# Patient Record
Sex: Female | Born: 1955 | Race: White | Hispanic: No | Marital: Married | Smoking: Former smoker
Health system: Southern US, Community
[De-identification: ages and names within clinical notes are randomized; demographics above are authoritative.]

## PROBLEM LIST (undated history)

## (undated) DIAGNOSIS — R1310 Dysphagia, unspecified: Secondary | ICD-10-CM

## (undated) DIAGNOSIS — G8929 Other chronic pain: Secondary | ICD-10-CM

## (undated) DIAGNOSIS — Z8719 Personal history of other diseases of the digestive system: Secondary | ICD-10-CM

## (undated) DIAGNOSIS — R51 Headache: Secondary | ICD-10-CM

## (undated) DIAGNOSIS — R109 Unspecified abdominal pain: Secondary | ICD-10-CM

## (undated) DIAGNOSIS — J989 Respiratory disorder, unspecified: Secondary | ICD-10-CM

## (undated) DIAGNOSIS — E039 Hypothyroidism, unspecified: Secondary | ICD-10-CM

## (undated) DIAGNOSIS — J45909 Unspecified asthma, uncomplicated: Secondary | ICD-10-CM

## (undated) DIAGNOSIS — T8859XA Other complications of anesthesia, initial encounter: Secondary | ICD-10-CM

## (undated) DIAGNOSIS — C801 Malignant (primary) neoplasm, unspecified: Secondary | ICD-10-CM

## (undated) DIAGNOSIS — T4145XA Adverse effect of unspecified anesthetic, initial encounter: Secondary | ICD-10-CM

## (undated) DIAGNOSIS — J302 Other seasonal allergic rhinitis: Secondary | ICD-10-CM

## (undated) DIAGNOSIS — K219 Gastro-esophageal reflux disease without esophagitis: Secondary | ICD-10-CM

## (undated) DIAGNOSIS — Z8709 Personal history of other diseases of the respiratory system: Secondary | ICD-10-CM

## (undated) DIAGNOSIS — R2230 Localized swelling, mass and lump, unspecified upper limb: Secondary | ICD-10-CM

## (undated) HISTORY — PX: APPENDECTOMY: SHX54

## (undated) HISTORY — DX: Unspecified asthma, uncomplicated: J45.909

## (undated) HISTORY — PX: ABDOMINAL HYSTERECTOMY: SHX81

## (undated) HISTORY — PX: NISSEN FUNDOPLICATION: SHX2091

## (undated) HISTORY — PX: THYROIDECTOMY: SHX17

## (undated) HISTORY — DX: Gastro-esophageal reflux disease without esophagitis: K21.9

## (undated) HISTORY — DX: Malignant (primary) neoplasm, unspecified: C80.1

## (undated) HISTORY — PX: SPLENECTOMY, TOTAL: SHX788

## (undated) HISTORY — PX: OTHER SURGICAL HISTORY: SHX169

---

## 1998-06-12 ENCOUNTER — Other Ambulatory Visit: Admission: RE | Admit: 1998-06-12 | Discharge: 1998-06-12 | Payer: Self-pay | Admitting: Obstetrics and Gynecology

## 1998-06-30 ENCOUNTER — Ambulatory Visit (HOSPITAL_COMMUNITY): Admission: RE | Admit: 1998-06-30 | Discharge: 1998-06-30 | Payer: Self-pay | Admitting: Internal Medicine

## 1998-07-31 ENCOUNTER — Other Ambulatory Visit: Admission: RE | Admit: 1998-07-31 | Discharge: 1998-07-31 | Payer: Self-pay | Admitting: Obstetrics and Gynecology

## 2000-04-27 ENCOUNTER — Other Ambulatory Visit: Admission: RE | Admit: 2000-04-27 | Discharge: 2000-04-27 | Payer: Self-pay | Admitting: Gynecology

## 2000-04-28 ENCOUNTER — Encounter (INDEPENDENT_AMBULATORY_CARE_PROVIDER_SITE_OTHER): Payer: Self-pay

## 2000-04-28 ENCOUNTER — Other Ambulatory Visit: Admission: RE | Admit: 2000-04-28 | Discharge: 2000-04-28 | Payer: Self-pay | Admitting: Obstetrics & Gynecology

## 2001-06-26 ENCOUNTER — Other Ambulatory Visit: Admission: RE | Admit: 2001-06-26 | Discharge: 2001-06-26 | Payer: Self-pay | Admitting: Obstetrics and Gynecology

## 2001-07-18 ENCOUNTER — Inpatient Hospital Stay (HOSPITAL_COMMUNITY): Admission: AD | Admit: 2001-07-18 | Discharge: 2001-07-18 | Payer: Self-pay | Admitting: Obstetrics and Gynecology

## 2001-08-04 ENCOUNTER — Ambulatory Visit (HOSPITAL_COMMUNITY): Admission: RE | Admit: 2001-08-04 | Discharge: 2001-08-04 | Payer: Self-pay | Admitting: Gastroenterology

## 2001-10-19 ENCOUNTER — Ambulatory Visit (HOSPITAL_COMMUNITY): Admission: RE | Admit: 2001-10-19 | Discharge: 2001-10-19 | Payer: Self-pay | Admitting: General Surgery

## 2001-10-19 ENCOUNTER — Encounter (INDEPENDENT_AMBULATORY_CARE_PROVIDER_SITE_OTHER): Payer: Self-pay | Admitting: *Deleted

## 2001-10-19 HISTORY — PX: LIPOMA EXCISION: SHX5283

## 2002-01-23 ENCOUNTER — Other Ambulatory Visit: Admission: RE | Admit: 2002-01-23 | Discharge: 2002-01-23 | Payer: Self-pay | Admitting: Obstetrics and Gynecology

## 2002-08-03 ENCOUNTER — Other Ambulatory Visit: Admission: RE | Admit: 2002-08-03 | Discharge: 2002-08-03 | Payer: Self-pay | Admitting: Obstetrics and Gynecology

## 2003-03-13 ENCOUNTER — Other Ambulatory Visit: Admission: RE | Admit: 2003-03-13 | Discharge: 2003-03-13 | Payer: Self-pay | Admitting: Obstetrics and Gynecology

## 2003-08-20 ENCOUNTER — Encounter: Admission: RE | Admit: 2003-08-20 | Discharge: 2003-08-20 | Payer: Self-pay | Admitting: Internal Medicine

## 2003-09-02 ENCOUNTER — Other Ambulatory Visit: Admission: RE | Admit: 2003-09-02 | Discharge: 2003-09-02 | Payer: Self-pay | Admitting: Obstetrics and Gynecology

## 2004-04-02 ENCOUNTER — Encounter: Admission: RE | Admit: 2004-04-02 | Discharge: 2004-07-01 | Payer: Self-pay | Admitting: Anesthesiology

## 2004-04-11 ENCOUNTER — Ambulatory Visit (HOSPITAL_COMMUNITY): Admission: RE | Admit: 2004-04-11 | Discharge: 2004-04-11 | Payer: Self-pay | Admitting: Anesthesiology

## 2004-05-12 ENCOUNTER — Ambulatory Visit: Payer: Self-pay | Admitting: Anesthesiology

## 2004-12-18 ENCOUNTER — Other Ambulatory Visit: Admission: RE | Admit: 2004-12-18 | Discharge: 2004-12-18 | Payer: Self-pay | Admitting: Obstetrics and Gynecology

## 2005-09-06 ENCOUNTER — Encounter: Admission: RE | Admit: 2005-09-06 | Discharge: 2005-09-06 | Payer: Self-pay | Admitting: Family Medicine

## 2009-08-27 ENCOUNTER — Encounter: Admission: RE | Admit: 2009-08-27 | Discharge: 2009-08-27 | Payer: Self-pay | Admitting: Obstetrics and Gynecology

## 2009-12-09 ENCOUNTER — Inpatient Hospital Stay (HOSPITAL_COMMUNITY): Admission: AD | Admit: 2009-12-09 | Discharge: 2009-12-13 | Payer: Self-pay | Admitting: Surgery

## 2009-12-09 HISTORY — PX: ESOPHAGOGASTRIC FUNDOPLASTY: SUR458

## 2009-12-09 HISTORY — PX: HIATAL HERNIA REPAIR: SHX195

## 2010-07-30 ENCOUNTER — Inpatient Hospital Stay (HOSPITAL_COMMUNITY)
Admission: RE | Admit: 2010-07-30 | Discharge: 2010-08-01 | Payer: Self-pay | Source: Home / Self Care | Attending: Obstetrics and Gynecology | Admitting: Obstetrics and Gynecology

## 2010-07-30 ENCOUNTER — Encounter (INDEPENDENT_AMBULATORY_CARE_PROVIDER_SITE_OTHER): Payer: Self-pay | Admitting: Obstetrics and Gynecology

## 2010-07-30 HISTORY — PX: TOTAL ABDOMINAL HYSTERECTOMY W/ BILATERAL SALPINGOOPHORECTOMY: SHX83

## 2010-09-15 ENCOUNTER — Ambulatory Visit (HOSPITAL_COMMUNITY)
Admission: RE | Admit: 2010-09-15 | Discharge: 2010-09-15 | Payer: Self-pay | Source: Home / Self Care | Attending: Obstetrics and Gynecology | Admitting: Obstetrics and Gynecology

## 2010-11-03 LAB — CBC
HCT: 33.5 % — ABNORMAL LOW (ref 36.0–46.0)
HCT: 44 % (ref 36.0–46.0)
Hemoglobin: 11.1 g/dL — ABNORMAL LOW (ref 12.0–15.0)
Hemoglobin: 14.7 g/dL (ref 12.0–15.0)
MCH: 33.2 pg (ref 26.0–34.0)
MCH: 33.7 pg (ref 26.0–34.0)
MCHC: 33.3 g/dL (ref 30.0–36.0)
MCHC: 33.4 g/dL (ref 30.0–36.0)
MCV: 101.2 fL — ABNORMAL HIGH (ref 78.0–100.0)
MCV: 99.4 fL (ref 78.0–100.0)
Platelets: 207 10*3/uL (ref 150–400)
Platelets: 272 10*3/uL (ref 150–400)
RBC: 3.31 MIL/uL — ABNORMAL LOW (ref 3.87–5.11)
RBC: 4.43 MIL/uL (ref 3.87–5.11)
RDW: 12.8 % (ref 11.5–15.5)
RDW: 13.5 % (ref 11.5–15.5)
WBC: 16.9 10*3/uL — ABNORMAL HIGH (ref 4.0–10.5)
WBC: 8.6 10*3/uL (ref 4.0–10.5)

## 2010-11-03 LAB — BASIC METABOLIC PANEL
BUN: 12 mg/dL (ref 6–23)
CO2: 28 mEq/L (ref 19–32)
Calcium: 9.7 mg/dL (ref 8.4–10.5)
Chloride: 106 mEq/L (ref 96–112)
Creatinine, Ser: 0.63 mg/dL (ref 0.4–1.2)
GFR calc Af Amer: >60 mL/min (ref >60)
GFR calc non Af Amer: >60 mL/min (ref >60)
Glucose, Bld: 99 mg/dL (ref 70–99)
Potassium: 5.1 mEq/L (ref 3.5–5.1)
Sodium: 143 mEq/L (ref 135–145)

## 2010-11-03 LAB — SURGICAL PCR SCREEN
MRSA, PCR: NEGATIVE
Staphylococcus aureus: POSITIVE — AB

## 2010-11-10 LAB — CBC
HCT: 35 % — ABNORMAL LOW (ref 36.0–46.0)
HCT: 37.6 % (ref 36.0–46.0)
HCT: 40.6 % (ref 36.0–46.0)
Hemoglobin: 11.6 g/dL — ABNORMAL LOW (ref 12.0–15.0)
Hemoglobin: 12.5 g/dL (ref 12.0–15.0)
Hemoglobin: 13.6 g/dL (ref 12.0–15.0)
MCHC: 33.1 g/dL (ref 30.0–36.0)
MCHC: 33.4 g/dL (ref 30.0–36.0)
MCHC: 33.4 g/dL (ref 30.0–36.0)
MCV: 97.5 fL (ref 78.0–100.0)
MCV: 98.2 fL (ref 78.0–100.0)
MCV: 98.4 fL (ref 78.0–100.0)
Platelets: 248 10*3/uL (ref 150–400)
Platelets: 253 10*3/uL (ref 150–400)
Platelets: 321 10*3/uL (ref 150–400)
RBC: 3.56 MIL/uL — ABNORMAL LOW (ref 3.87–5.11)
RBC: 3.82 MIL/uL — ABNORMAL LOW (ref 3.87–5.11)
RBC: 4.17 MIL/uL (ref 3.87–5.11)
RDW: 13.2 % (ref 11.5–15.5)
RDW: 13.3 % (ref 11.5–15.5)
RDW: 13.7 % (ref 11.5–15.5)
WBC: 13.7 10*3/uL — ABNORMAL HIGH (ref 4.0–10.5)
WBC: 6.3 10*3/uL (ref 4.0–10.5)
WBC: 9.8 10*3/uL (ref 4.0–10.5)

## 2011-01-08 ENCOUNTER — Other Ambulatory Visit (INDEPENDENT_AMBULATORY_CARE_PROVIDER_SITE_OTHER): Payer: Self-pay | Admitting: Surgery

## 2011-01-08 DIAGNOSIS — R102 Pelvic and perineal pain: Secondary | ICD-10-CM

## 2011-01-08 DIAGNOSIS — K469 Unspecified abdominal hernia without obstruction or gangrene: Secondary | ICD-10-CM

## 2011-01-08 NOTE — Procedures (Signed)
NAME:  MAELIN, KURKOWSKI NO.:  [id number]   MEDICAL RECORD NO.:  [id number]                   PATIENT TYPE:   LOCATION:                                       FACILITY:  [facility]   PHYSICIAN:  Celene Kras, MD                     DATE OF BIRTH:  1956/06/30   DATE OF PROCEDURE:  04/21/2004  DATE OF DISCHARGE:                                 OPERATIVE REPORT   HISTORY:  [facility].  I  evaluated her via the health and history form, with a 14-point review of  systems.  We spent a considerable period of time discussing the treatment  limitations and options with her and with her husband.  A review of the MRI.  She has biomechanical pain, and what I do believe is abdominal pain,  consistent with a possible radicular component.  From a broad-brush stroke  perspective, it is reasonable to trial at least one thoracic epidural, and  then possibly either move towards an intercostal block or hypogastric plexus  block, predicated on her decision tree.  Her pain appears to be a mixed  presentation, and I reviewed this with her.  A review of medications.  Other life style enhancements reviewed as well.   PHYSICAL EXAMINATION:  Objectively, no significant change in the  neurological or musculoskeletal presentation.   IMPRESSION:  1.  Degenerative spine disease, thoracic spine.  2.  Abdominal pain, status post traumatic incident.   PLAN:  Thoracic epidural.  She has consented.   DESCRIPTION OF PROCEDURE:  The patient is taken to the fluoroscopy suite and  placed in the prone position.  The back is prepped and draped in the usual  fashion.  Using a Hustead needle, I advanced to the T9-10 interspace without  any evidence of CSF, heme, or paresthesias.  A test block uneventfully,  followed by 40 mg of Aristocort, and flushed the needle.  The patient tolerated the procedure well with no complications from our  procedure.   Appropriate recovery.  Discharge instructions given.                                                Celene Kras, MD    HH/MEDQ  D:  04/21/2004 10:04:48  T:  04/21/2004 11:58:00  Job:  [id number]

## 2011-01-08 NOTE — Assessment & Plan Note (Signed)
PATIENT:  Sarah Huber.   DATE OF BIRTH:  09/30/1955.   SURGEON:  Jewel Baize. Stevphen Rochester, M.D.   Shaima Sardinas [facility] and I evaluated  her via health and history form and 14-point review of systems.   1.  Sarah Huber comes back today, relating that she has not had significant      benefit from aortorenal plexus block.  I do think it is reasonable at      this time to go ahead and proceed with celiac plexus block and then      reassess her.  Her pain is atypical, but if she does have a component to      her colon or intra-abdominal structures in the upper quadrant, perhaps a      celiac plexus would be more of a directed care approach.  I do not      necessarily think we need to move forward in further diagnostics at this      time and defer to primary care in that regard.   1.  Will hold off with the block today as she is anticipating a thyroid      scan.  As I relate to she and her husband, we are going to need to be      using contrast so that we can properly assess needle placement and so we      will give her a couple of weeks.   1.  The Demerol seems to be helping her.  She is aware of the potential      habituating nature and cautions with this medication, but seems to be      using it sparingly and appropriately as she brings this in.   1.  Predicate further therapeutic adjustment based on overall response.   OBJECTIVE:  Exam not performed today.  She has no new neurologic or  musculoskeletal so described historically.   IMPRESSION:  Abdominal pain, unspecified.   PLAN:  Celiac plexus block and followup.  Discharge instructions given.  We  will see her in followup.  Nursing teaching and physician discussion with  she and her husband.       HH/MedQ  D:  05/26/2004 10:24:43  T:  05/26/2004 11:20:45  Job #:  [id number]

## 2011-01-08 NOTE — Op Note (Signed)
[facility]  Patient:    Sarah Huber, Sarah Huber Visit Number: [id number] MRN: [id number]          Service Type: DSU Location: DAY Attending Physician:  Carson Myrtle Dictated by:   Sheppard Plumber Earlene Plater, M.D. Proc. Date: 10/19/01 Admit Date:  10/19/2001                             Operative Report  PREOPERATIVE DIAGNOSIS:  Mass left groin, suspect node.  POSTOPERATIVE DIAGNOSIS:  Lipoma left groin.  OPERATION:  SURGEON:  Timothy E. Earlene Plater, M.D.  ASSISTANT:  Dellia Beckwith, M.D.  ANESTHESIA:  Local standby.  INDICATIONS:  The patient has had recurrent problems with apparent hidradenitis and lymph node enlargement in the groin areas.  A previous node has been removed from the left groin.  She presented in the office with a worrisome mass that she wished to have removed.  DESCRIPTION OF PROCEDURE:  The patient was taken to the operating room, IV started and sedation given.  She and I localized the node and agreed to its position.  The area was shaved, prepped and draped in the usual fashion, and 0.25% Marcaine was used for local anesthesia.  A skin incision was made over the palpable mass and a lipoma popped into the wound, and was completely removed and submitted to pathology.  Careful exploration freed the wound and through the skin revealed no other nodularity or suspected masses.  This completed the procedure.  The wound was closed with subcuticular sutures and Steri-Strips.  Counts were correct.  She tolerated it well.  She was removed to the recovery room in good condition.  INSTRUCTIONS:  Written and verbal instructions include Darvocet-N #24 as needed, and she will be followed as an outpatient. Dictated by:   Sheppard Plumber Earlene Plater, M.D. Attending Physician:  Carson Myrtle DD:  10/19/01 TD:  10/19/01 Job: [id number]

## 2011-01-08 NOTE — Procedures (Signed)
[place]  Patient:    Sarah Huber, Sarah Huber Visit Number: [id number] MRN: [id number]          Service Type: END Location: ENDO Attending Physician:  Charna [name] Dictated by:   Anselmo Rod, M.D. Proc. Date: 08/04/01 Admit Date:  08/04/2001   CC:         Marcelle Overlie, M.D.  Crista Luria, M.D.   Procedure Report  DATE OF BIRTH:  [birth year]  PROCEDURE PERFORMED:  Colonoscopy.  ENDOSCOPIST:  Anselmo Rod, M.D.  INSTRUMENT USED:  Olympus video colonoscope.  INDICATION FOR PROCEDURE:  A 55 year old white female undergoing a colonoscopy for colorectal cancer screening.  The patient has a family history of colon cancer.  PREPROCEDURE PREPARATION:  Informed consent was procured from the patient. The patient was fasted for 8 hours prior to the procedure and prepped with a bottle of magnesium citrate and a gallon of NuLytely the night prior to the procedure.  PREPROCEDURE PHYSICAL:  Patient has stable vital signs.  NECK: Supple.  CHEST:  Clear to auscultation. S1, S2 regular.  ABDOMEN:  Soft with normal bowel sounds.  DESCRIPTION OF PROCEDURE:  The patient was placed in the left lateral decubitus position and sedated with 60 mg of Demerol and 6 mg of Versed intravenously.  Once the patient was adequately sedated and maintained on low-flow oxygen and continuous cardiac monitoring, the Olympus video colonoscope was advanced from the rectum to the cecum without difficulty. Except for a few left-sided diverticula and small, nonbleeding internal and external hemorrhoids, no other abnormalities were seen.  IMPRESSION: 1. Early left-sided diverticulosis. 2. Small, nonbleeding internal and external hemorrhoids. 3. No masses or polyps seen.  RECOMMENDATIONS: 1. High fiber diet has been recommended for the patient. 2. Repeat colorectal cancer screening was recommended in the next five years    unless the patient were to develop any  abnormal symptoms in the interim. 3. Outpatient follow-up was advised on a p.r.n. basis. Dictated by:   Anselmo Rod, M.D. Attending Physician:  Charna [name] DD:  08/04/01 TD:  08/04/01 Job: [id number]

## 2011-01-08 NOTE — Procedures (Signed)
NAME:  Sarah Huber, Sarah Huber                           ACCOUNT NO.:  [id number]   MEDICAL RECORD NO.:  [id number]                   PATIENT TYPE:  REC   LOCATION:  TPC                                  FACILITY:  [facility]   PHYSICIAN:  Celene Kras, MD                     DATE OF BIRTH:  [birth year]   DATE OF PROCEDURE:  05/12/2004  DATE OF DISCHARGE:                                 OPERATIVE REPORT   PATIENT:  Sarah Huber.   DATE OF BIRTH:  [birth year].   SURGEON:  Jewel Baize. Stevphen Rochester, M.D.   [facility] and I evaluated  her. I have reviewed the Health and history form. I reviewed the 14-point  review of systems.   1.  Sarah Huber was afforded only modest benefit from previous interventional      procedure, that being thoracic epidural. She is still complaining of      intra-abdominal discomfort, seemingly more internal, left radiation,      typical to her pain. I reviewed the MRI, progress to date, and overall      directed care approach.  2.  I think it is reasonable to at least trial a plexus block as both      diagnostic and therapeutic. She is moving forward with further      diagnostics with this vague diagnosis of colonic reflux but is being      assessed also for a covert cancer. She is losing weight, and I do      believe that we need to be fairly aggressive in our interventional and      pain management approach.  3.  I am going to place her on Demerol. I understand the unique      characteristics of this drug and relate them to her. As Demerol does      have a central dampening effect to a moderate degree, may actually work      a little bit better with the mixed picture of her pain presentation,      most likely secondary to central amplification and vague abdominal      discomfort.  4.  Other lifestyle enhancements reviewed. She is a forthright individual. I      reviewed the risks of this medication, potential complications,  potential habituation nature, cautions to driving and making important      cognitive decisions. I also reviewed the opioid consent.   OBJECTIVE:  Diffuse abdominal discomfort directed leftward, just inferior to  the rib margin. Identified no hepatosplenomegaly or flank pain. No evidence  of further advancing spinal __________ disorder.   IMPRESSION:  Abdominal pain, unspecified. Colonic reflux, cancer workup in  process. Abdominal pain with radiation, most likely secondary to accentuated  central amplification.   PLAN:  Aortorenal/iliac plexus block. She is consented. I chose this block  as I do not think that the hypogastric component is our most problematic  feature as her pain is more upper quadrant. It is not classically directly  related to either pancreatic component or an actual colonic component but  does have some features here. We may trial a little bit lower on the  injection today, possibly moving directly to a celiac plexus block and  follow up if she has a response. Risks, complications, and options are fully  outlined, and she wishes to proceed.   She is consented.   The patient is taken to the fluoroscopy suite and placed in a prone  position. Back prepped and draped in the usual fashion. Using a 22-gauge  spinal needle, I advanced the anterior surface of L2-3 and confirmed  placement of multiple fluoroscopic positions. I test blocked uneventfully.  No heme, CSF, or paresthesia identified during the procedure and then  followed with 4 cc of lidocaine 1% MPF and 40 mg of Aristocort.   Tolerated this procedure well. Near complete diminishment in pain perception  in the recovery area. Discharge instructions given. Will see her in  followup.       HH/MEDQ  D:  05/12/2004 09:56:13  T:  05/13/2004 66:44:03  Job:  [id number]

## 2011-01-12 ENCOUNTER — Ambulatory Visit (HOSPITAL_COMMUNITY)
Admission: RE | Admit: 2011-01-12 | Discharge: 2011-01-12 | Disposition: A | Discharge status: Home / Self Care | Payer: PRIVATE HEALTH INSURANCE | Source: Ambulatory Visit | Attending: Surgery | Admitting: Surgery

## 2011-01-12 DIAGNOSIS — R102 Pelvic and perineal pain: Secondary | ICD-10-CM

## 2011-01-12 DIAGNOSIS — R1032 Left lower quadrant pain: Secondary | ICD-10-CM | POA: Insufficient documentation

## 2011-01-12 DIAGNOSIS — Z90710 Acquired absence of both cervix and uterus: Secondary | ICD-10-CM | POA: Insufficient documentation

## 2011-01-12 DIAGNOSIS — K469 Unspecified abdominal hernia without obstruction or gangrene: Secondary | ICD-10-CM

## 2011-01-12 MED ORDER — IOHEXOL 300 MG/ML  SOLN
100.0000 mL | Freq: Once | INTRAMUSCULAR | Status: AC | PRN
  Administered 2011-01-12: 100 mL via INTRAVENOUS

## 2011-01-21 ENCOUNTER — Encounter (HOSPITAL_COMMUNITY): Payer: PRIVATE HEALTH INSURANCE

## 2011-01-21 ENCOUNTER — Other Ambulatory Visit (INDEPENDENT_AMBULATORY_CARE_PROVIDER_SITE_OTHER): Payer: Self-pay | Admitting: Surgery

## 2011-01-21 LAB — BASIC METABOLIC PANEL
BUN: 19 mg/dL (ref 6–23)
CO2: 29 mEq/L (ref 19–32)
Calcium: 10.1 mg/dL (ref 8.4–10.5)
Chloride: 103 mEq/L (ref 96–112)
Creatinine, Ser: 0.8 mg/dL (ref 0.4–1.2)
GFR calc Af Amer: >60 mL/min (ref >60)
GFR calc non Af Amer: >60 mL/min (ref >60)
Glucose, Bld: 81 mg/dL (ref 70–99)
Potassium: 4.3 mEq/L (ref 3.5–5.1)
Sodium: 139 mEq/L (ref 135–145)

## 2011-01-21 LAB — SURGICAL PCR SCREEN
MRSA, PCR: NEGATIVE
Staphylococcus aureus: NEGATIVE

## 2011-01-21 LAB — CBC
HCT: 38.9 % (ref 36.0–46.0)
Hemoglobin: 13.1 g/dL (ref 12.0–15.0)
MCH: 32.1 pg (ref 26.0–34.0)
MCHC: 33.7 g/dL (ref 30.0–36.0)
MCV: 95.3 fL (ref 78.0–100.0)
Platelets: 340 10*3/uL (ref 150–400)
RBC: 4.08 MIL/uL (ref 3.87–5.11)
RDW: 12.5 % (ref 11.5–15.5)
WBC: 8.4 10*3/uL (ref 4.0–10.5)

## 2011-01-27 ENCOUNTER — Ambulatory Visit (HOSPITAL_COMMUNITY)
Admission: AD | Admit: 2011-01-27 | Discharge: 2011-01-29 | Disposition: A | Discharge status: Home / Self Care | Payer: PRIVATE HEALTH INSURANCE | Source: Ambulatory Visit | Attending: Surgery | Admitting: Surgery

## 2011-01-27 ENCOUNTER — Other Ambulatory Visit (INDEPENDENT_AMBULATORY_CARE_PROVIDER_SITE_OTHER): Payer: Self-pay | Admitting: Surgery

## 2011-01-27 DIAGNOSIS — IMO0002 Reserved for concepts with insufficient information to code with codable children: Secondary | ICD-10-CM | POA: Insufficient documentation

## 2011-01-27 DIAGNOSIS — M7989 Other specified soft tissue disorders: Secondary | ICD-10-CM | POA: Insufficient documentation

## 2011-01-27 DIAGNOSIS — Y836 Removal of other organ (partial) (total) as the cause of abnormal reaction of the patient, or of later complication, without mention of misadventure at the time of the procedure: Secondary | ICD-10-CM | POA: Insufficient documentation

## 2011-01-27 DIAGNOSIS — J45909 Unspecified asthma, uncomplicated: Secondary | ICD-10-CM | POA: Insufficient documentation

## 2011-01-27 DIAGNOSIS — Z9109 Other allergy status, other than to drugs and biological substances: Secondary | ICD-10-CM | POA: Insufficient documentation

## 2011-01-27 DIAGNOSIS — R1032 Left lower quadrant pain: Secondary | ICD-10-CM | POA: Insufficient documentation

## 2011-01-27 DIAGNOSIS — Z90710 Acquired absence of both cervix and uterus: Secondary | ICD-10-CM | POA: Insufficient documentation

## 2011-01-27 DIAGNOSIS — Z79899 Other long term (current) drug therapy: Secondary | ICD-10-CM | POA: Insufficient documentation

## 2011-01-27 HISTORY — PX: LAPAROSCOPIC LYSIS OF ADHESIONS: SHX5905

## 2011-02-01 ENCOUNTER — Encounter (INDEPENDENT_AMBULATORY_CARE_PROVIDER_SITE_OTHER): Payer: Self-pay | Admitting: Surgery

## 2011-02-03 NOTE — Op Note (Signed)
  [name], Sarah NO.:  [id number]  MEDICAL RECORD NO.:  [id number]  LOCATION:  1526                         FACILITY:  [facility]  PHYSICIAN:  Thornton Park. Daphine Deutscher, MD  DATE OF BIRTH:  December 13, 1955  DATE OF PROCEDURE:  01/27/2011 DATE OF DISCHARGE:                              OPERATIVE REPORT   PREOPERATIVE DIAGNOSIS:  Persistent left lower quadrant abdominal pain after a hysterectomy through a Pfannenstiel incision.  PROCEDURE:  Laparoscopy, exploration of wound, and resection of scar tissue.  SURGEON:  Thornton Park. Daphine Deutscher, MD  ASSISTANT:  None.  ANESTHESIA:  General endotracheal.  DESCRIPTION OF PROCEDURE:  This 55 year old white female was taken to room #1 at [facility] after I had marked her point of maximal tenderness.  This was kind of the mediolateral aspect of a Pfannenstiel incision on the left side.  I went in initially trying to get into the right upper quadrant, but met some resistance and I did not push further with the Optiview, but went over on the left side and got in and then was actually extraperitoneal on the right side.  Through this, two 5 mm ports and an angled 5 mm 30-degree scope, I was able to completely examine the pelvis including the point of maximal tenderness.  At that point, there was no evidence of any kind of hernia or anything __________ .  I surveyed this very well.  I then, with the scope in place, marked cutdown on the area where she had had the tenderness and I felt some lumpiness to this scar tissue.  I did find 1 fairly large vessel __________, which I oversewed with a figure-of-8 suture of 4-0 Vicryl.  I cut out these knots and sent this off for pathology.  I doubt that she has endometriosis considering her age, but I went ahead and sent all that to look for that as well.  I injected her with some Exparel in all 3 wounds.  Because she is allergic to adhesive tape, I closed her with 4-0 Vicryl subcutaneously and  subcuticularly and put Adaptic on the wounds and some light dressings.  She will be kept overnight for observation.  I did inject her with Exparel for long-acting pain medicine on her incision.  The patient will be followed up and will be kept for overnight observation.     Thornton Park Daphine Deutscher, MD     MBM/MEDQ  D:  01/27/2011  T:  01/27/2011  Job:  [id number]  cc:   Marcelino Duster L. Vincente Poli, M.D. Fax: [phone]  Electronically Signed by Luretha Murphy MD on 02/03/2011 04:33:34 PM

## 2011-02-22 ENCOUNTER — Ambulatory Visit (INDEPENDENT_AMBULATORY_CARE_PROVIDER_SITE_OTHER): Payer: PRIVATE HEALTH INSURANCE | Admitting: Surgery

## 2011-02-22 ENCOUNTER — Encounter (INDEPENDENT_AMBULATORY_CARE_PROVIDER_SITE_OTHER): Payer: Self-pay | Admitting: Surgery

## 2011-02-22 DIAGNOSIS — Z9889 Other specified postprocedural states: Secondary | ICD-10-CM

## 2011-02-22 NOTE — Progress Notes (Signed)
The patient returns today after I had explored the wound done by her gynecologist. I excised the scar tissue which pathology showed a benign. Symptomatically her pain is resolved and she seems to be doing well.  Her swallowing is fine and she is not reporting reflux. She saw Claire Shown who is following her for some gastric emptying issues of a chronic nature. She overall looks well I think is doing fine and will follow as needed.

## 2012-01-06 ENCOUNTER — Other Ambulatory Visit: Payer: Self-pay | Admitting: Obstetrics and Gynecology

## 2012-11-16 ENCOUNTER — Encounter (INDEPENDENT_AMBULATORY_CARE_PROVIDER_SITE_OTHER): Payer: Self-pay

## 2012-11-17 ENCOUNTER — Ambulatory Visit (INDEPENDENT_AMBULATORY_CARE_PROVIDER_SITE_OTHER): Payer: BC Managed Care – PPO | Admitting: General Surgery

## 2012-11-17 ENCOUNTER — Other Ambulatory Visit (INDEPENDENT_AMBULATORY_CARE_PROVIDER_SITE_OTHER): Payer: Self-pay | Admitting: General Surgery

## 2012-11-17 ENCOUNTER — Encounter (INDEPENDENT_AMBULATORY_CARE_PROVIDER_SITE_OTHER): Payer: Self-pay | Admitting: General Surgery

## 2012-11-17 VITALS — BP 122/84 | HR 98 | Temp 98.8°F | Resp 18 | Ht 66.0 in | Wt 145.2 lb

## 2012-11-17 DIAGNOSIS — R2230 Localized swelling, mass and lump, unspecified upper limb: Secondary | ICD-10-CM | POA: Insufficient documentation

## 2012-11-17 DIAGNOSIS — R2232 Localized swelling, mass and lump, left upper limb: Secondary | ICD-10-CM

## 2012-11-17 DIAGNOSIS — R229 Localized swelling, mass and lump, unspecified: Secondary | ICD-10-CM

## 2012-11-21 DIAGNOSIS — R2230 Localized swelling, mass and lump, unspecified upper limb: Secondary | ICD-10-CM

## 2012-11-21 HISTORY — DX: Localized swelling, mass and lump, unspecified upper limb: R22.30

## 2012-11-29 ENCOUNTER — Other Ambulatory Visit (INDEPENDENT_AMBULATORY_CARE_PROVIDER_SITE_OTHER): Payer: Self-pay | Admitting: General Surgery

## 2012-11-29 ENCOUNTER — Ambulatory Visit
Admission: RE | Admit: 2012-11-29 | Discharge: 2012-11-29 | Disposition: A | Discharge status: Home / Self Care | Payer: BC Managed Care – PPO | Source: Ambulatory Visit | Attending: General Surgery | Admitting: General Surgery

## 2012-11-29 DIAGNOSIS — R2232 Localized swelling, mass and lump, left upper limb: Secondary | ICD-10-CM

## 2012-12-04 ENCOUNTER — Encounter (INDEPENDENT_AMBULATORY_CARE_PROVIDER_SITE_OTHER): Payer: Self-pay | Admitting: General Surgery

## 2012-12-04 ENCOUNTER — Ambulatory Visit (INDEPENDENT_AMBULATORY_CARE_PROVIDER_SITE_OTHER): Payer: BC Managed Care – PPO | Admitting: General Surgery

## 2012-12-04 VITALS — BP 122/82 | HR 68 | Temp 97.3°F | Resp 12 | Ht 66.0 in | Wt 147.2 lb

## 2012-12-04 DIAGNOSIS — R2232 Localized swelling, mass and lump, left upper limb: Secondary | ICD-10-CM

## 2012-12-04 DIAGNOSIS — R229 Localized swelling, mass and lump, unspecified: Secondary | ICD-10-CM

## 2012-12-04 NOTE — Patient Instructions (Signed)
Plan to excise mass left axilla

## 2012-12-04 NOTE — Progress Notes (Signed)
Subjective:     Patient ID: Sarah Huber, female   DOB: 1956-06-03, 57 y.o.   MRN: [id number]  HPI We're asked to see the patient in consultation by Dr. Tawanna Cooler to evaluate her for a mass in her left axilla. The patient is a 57 year old white female who first noticed a mass in the left axilla around November. She has discomfort associated with it and also notes some numbness down the triceps area of her left arm. She believes the areas gotten a little bit larger since she first noticed it. She is concerned about this because she states she has had thyroid cancer and cervical cancer. She has maintained her weight.  Review of Systems  Constitutional: Negative.   HENT: Negative.   Eyes: Negative.   Respiratory: Negative.   Cardiovascular: Negative.   Gastrointestinal: Negative.   Endocrine: Negative.   Genitourinary: Negative.   Musculoskeletal: Negative.   Skin: Negative.   Allergic/Immunologic: Negative.   Neurological: Negative.   Hematological: Negative.   Psychiatric/Behavioral: Negative.        Objective:   Physical Exam  Constitutional: She is oriented to person, place, and time. She appears well-developed and well-nourished.  HENT:  Head: Normocephalic and atraumatic.  Eyes: Conjunctivae and EOM are normal. Pupils are equal, round, and reactive to light.  Neck: Normal range of motion. Neck supple.  Cardiovascular: Normal rate, regular rhythm and normal heart sounds.   Pulmonary/Chest: Effort normal and breath sounds normal.  Abdominal: Soft. Bowel sounds are normal.  Musculoskeletal: Normal range of motion.  There is a small palpable fatty fullness in the left axilla  Lymphadenopathy:    She has no cervical adenopathy.  Neurological: She is alert and oriented to person, place, and time.  Skin: Skin is warm and dry.  Psychiatric: She has a normal mood and affect. Her behavior is normal.       Assessment:     The patient has what feels like a small lipoma in the left  axilla. Because of her history of cancer I think it would be reasonable to ultrasound the axilla to make sure there is no other abnormalities present. She also probably needs a mammogram to make sure there are no lesions of the breast.     Plan:     We will obtain these studies and plan to see her back in the next 2-3 weeks

## 2012-12-04 NOTE — Progress Notes (Signed)
Subjective:     Patient ID: Sarah Huber, female   DOB: 1956-03-08, 57 y.o.   MRN: [id number]  HPI The patient is a 57 -year-old white female who first noticed a mass in the left axilla around November. The mass is causing her discomfort and some numbness down her left arm. It is gradually getting larger. She is very concerned about it would like to have it removed. 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  Review of Systems  Constitutional: Negative.   HENT: Negative.   Eyes: Negative.   Respiratory: Negative.   Cardiovascular: Negative.   Gastrointestinal: Negative.   Endocrine: Negative.   Genitourinary: Negative.   Musculoskeletal: Negative.   Skin: Negative.   Allergic/Immunologic: Negative.   Neurological: Negative.   Hematological: Negative.   Psychiatric/Behavioral: Negative.        Objective:   Physical Exam  Constitutional: She is oriented to person, place, and time. She appears well-developed and well-nourished.  HENT:  Head: Normocephalic and atraumatic.  Eyes: Conjunctivae and EOM are normal. Pupils are equal, round, and reactive to light.  Neck: Normal range of motion. Neck supple.  Cardiovascular: Normal rate, regular rhythm and normal heart sounds.   Pulmonary/Chest: Effort normal and breath sounds normal.  Abdominal: Soft. Bowel sounds are normal.  Musculoskeletal: Normal range of motion.  There is some fatty prominence in the left axilla  Neurological: She is alert and oriented to person, place, and time.  Skin: Skin is warm and dry.  Psychiatric: She has a normal mood and affect. Her behavior is normal.       Assessment:     The patient has what appears to be a small lipoma in the left axilla which is causing her some discomfort. Because of this I think it would be reasonable to remove it. I have  discussed with her in detail the risks and benefits of the operation to do this as well as some of the technical aspects and she understands and wishes to proceed. She understands that removing this is not guaranteed to relieve her discomfort or numbness in her arm.     Plan:     Plan to excise mass from the left axilla

## 2012-12-05 ENCOUNTER — Ambulatory Visit
Admission: RE | Admit: 2012-12-05 | Discharge: 2012-12-05 | Disposition: A | Discharge status: Home / Self Care | Payer: BC Managed Care – PPO | Source: Ambulatory Visit | Attending: General Surgery | Admitting: General Surgery

## 2012-12-05 ENCOUNTER — Other Ambulatory Visit (INDEPENDENT_AMBULATORY_CARE_PROVIDER_SITE_OTHER): Payer: Self-pay | Admitting: General Surgery

## 2012-12-05 ENCOUNTER — Other Ambulatory Visit: Payer: BC Managed Care – PPO

## 2012-12-05 DIAGNOSIS — R2232 Localized swelling, mass and lump, left upper limb: Secondary | ICD-10-CM

## 2012-12-12 ENCOUNTER — Telehealth (INDEPENDENT_AMBULATORY_CARE_PROVIDER_SITE_OTHER): Payer: Self-pay

## 2012-12-12 NOTE — Telephone Encounter (Signed)
LMOM to call back. Path came back benign.

## 2012-12-13 ENCOUNTER — Telehealth (INDEPENDENT_AMBULATORY_CARE_PROVIDER_SITE_OTHER): Payer: Self-pay

## 2012-12-13 NOTE — Telephone Encounter (Signed)
Pt returned call to [facility]. I advised her per note in epic her pathology is benign.

## 2012-12-14 ENCOUNTER — Encounter (HOSPITAL_BASED_OUTPATIENT_CLINIC_OR_DEPARTMENT_OTHER): Payer: Self-pay | Admitting: *Deleted

## 2012-12-14 NOTE — Pre-Procedure Instructions (Signed)
Discussed small airway and hx. Of multiple vocal cord surgeries with Dr. Gelene Mink; pt. OK to come for surgery.  Copy of last anesthesia record (2012) placed on chart.

## 2012-12-14 NOTE — Pre-Procedure Instructions (Signed)
To come 12/19/2012 for BMET

## 2012-12-15 ENCOUNTER — Encounter (HOSPITAL_BASED_OUTPATIENT_CLINIC_OR_DEPARTMENT_OTHER)
Admission: RE | Admit: 2012-12-15 | Discharge: 2012-12-15 | Disposition: A | Discharge status: Home / Self Care | Payer: BC Managed Care – PPO | Source: Ambulatory Visit | Attending: General Surgery | Admitting: General Surgery

## 2012-12-15 LAB — BASIC METABOLIC PANEL
Calcium: 9.4 mg/dL (ref 8.4–10.5)
GFR calc Af Amer: >90 mL/min (ref >90)
GFR calc non Af Amer: 81 mL/min — ABNORMAL LOW (ref >90)
Potassium: 5 mEq/L (ref 3.5–5.1)
Sodium: 139 mEq/L (ref 135–145)

## 2012-12-20 ENCOUNTER — Encounter (HOSPITAL_BASED_OUTPATIENT_CLINIC_OR_DEPARTMENT_OTHER): Payer: Self-pay | Admitting: *Deleted

## 2012-12-20 ENCOUNTER — Encounter (HOSPITAL_BASED_OUTPATIENT_CLINIC_OR_DEPARTMENT_OTHER)
Admission: RE | Disposition: A | Discharge status: Home / Self Care | Payer: Self-pay | Source: Ambulatory Visit | Attending: General Surgery

## 2012-12-20 ENCOUNTER — Encounter (HOSPITAL_BASED_OUTPATIENT_CLINIC_OR_DEPARTMENT_OTHER): Payer: Self-pay | Admitting: Certified Registered Nurse Anesthetist

## 2012-12-20 ENCOUNTER — Ambulatory Visit (HOSPITAL_BASED_OUTPATIENT_CLINIC_OR_DEPARTMENT_OTHER): Payer: BC Managed Care – PPO | Admitting: Certified Registered Nurse Anesthetist

## 2012-12-20 ENCOUNTER — Ambulatory Visit (HOSPITAL_BASED_OUTPATIENT_CLINIC_OR_DEPARTMENT_OTHER)
Admission: RE | Admit: 2012-12-20 | Discharge: 2012-12-20 | Disposition: A | Discharge status: Home / Self Care | Payer: BC Managed Care – PPO | Source: Ambulatory Visit | Attending: General Surgery | Admitting: General Surgery

## 2012-12-20 DIAGNOSIS — R2232 Localized swelling, mass and lump, left upper limb: Secondary | ICD-10-CM

## 2012-12-20 DIAGNOSIS — R229 Localized swelling, mass and lump, unspecified: Secondary | ICD-10-CM | POA: Insufficient documentation

## 2012-12-20 DIAGNOSIS — D1739 Benign lipomatous neoplasm of skin and subcutaneous tissue of other sites: Secondary | ICD-10-CM

## 2012-12-20 HISTORY — DX: Unspecified abdominal pain: R10.9

## 2012-12-20 HISTORY — PX: MASS EXCISION: SHX2000

## 2012-12-20 HISTORY — DX: Other chronic pain: G89.29

## 2012-12-20 HISTORY — DX: Hypothyroidism, unspecified: E03.9

## 2012-12-20 HISTORY — DX: Other seasonal allergic rhinitis: J30.2

## 2012-12-20 HISTORY — DX: Headache: R51

## 2012-12-20 HISTORY — DX: Personal history of other diseases of the respiratory system: Z87.09

## 2012-12-20 HISTORY — DX: Dysphagia, unspecified: R13.10

## 2012-12-20 HISTORY — DX: Adverse effect of unspecified anesthetic, initial encounter: T41.45XA

## 2012-12-20 HISTORY — DX: Localized swelling, mass and lump, unspecified upper limb: R22.30

## 2012-12-20 HISTORY — DX: Respiratory disorder, unspecified: J98.9

## 2012-12-20 HISTORY — DX: Personal history of other diseases of the digestive system: Z87.19

## 2012-12-20 HISTORY — DX: Other complications of anesthesia, initial encounter: T88.59XA

## 2012-12-20 SURGERY — EXCISION MASS
Anesthesia: General | Site: Axilla | Laterality: Left | Wound class: Clean

## 2012-12-20 MED ORDER — ONDANSETRON HCL 4 MG/2ML IJ SOLN: 4.0000 mg | Freq: Once | INTRAMUSCULAR | Status: DC | PRN

## 2012-12-20 MED ORDER — MIDAZOLAM HCL 2 MG/ML PO SYRP: 12.0000 mg | ORAL_SOLUTION | Freq: Once | ORAL | Status: DC | PRN

## 2012-12-20 MED ORDER — VANCOMYCIN HCL IN DEXTROSE 1-5 GM/200ML-% IV SOLN
1000.0000 mg | INTRAVENOUS | Status: AC
  Administered 2012-12-20: 1000 mg via INTRAVENOUS

## 2012-12-20 MED ORDER — OXYCODONE-ACETAMINOPHEN 5-325 MG PO TABS: 1.0000 | ORAL_TABLET | ORAL | Status: DC | PRN

## 2012-12-20 MED ORDER — MIDAZOLAM HCL 5 MG/5ML IJ SOLN
INTRAMUSCULAR | Status: DC | PRN
  Administered 2012-12-20: 2 mg via INTRAVENOUS

## 2012-12-20 MED ORDER — OXYCODONE HCL 5 MG/5ML PO SOLN
5.0000 mg | Freq: Once | ORAL | Status: AC | PRN
Start: 2012-12-20 — End: 2012-12-20

## 2012-12-20 MED ORDER — FENTANYL CITRATE 0.05 MG/ML IJ SOLN
INTRAMUSCULAR | Status: DC | PRN
  Administered 2012-12-20: 100 ug via INTRAVENOUS

## 2012-12-20 MED ORDER — BUPIVACAINE-EPINEPHRINE 0.25% -1:200000 IJ SOLN
INTRAMUSCULAR | Status: DC | PRN
  Administered 2012-12-20: 16 mL

## 2012-12-20 MED ORDER — ONDANSETRON HCL 4 MG/2ML IJ SOLN
INTRAMUSCULAR | Status: DC | PRN
  Administered 2012-12-20: 4 mg via INTRAVENOUS

## 2012-12-20 MED ORDER — HYDROMORPHONE HCL PF 1 MG/ML IJ SOLN
0.2500 mg | INTRAMUSCULAR | Status: DC | PRN
  Administered 2012-12-20 (×2): 0.25 mg via INTRAVENOUS
  Administered 2012-12-20 (×3): 0.5 mg via INTRAVENOUS

## 2012-12-20 MED ORDER — MIDAZOLAM HCL 2 MG/2ML IJ SOLN: 1.0000 mg | INTRAMUSCULAR | Status: DC | PRN

## 2012-12-20 MED ORDER — PROPOFOL 10 MG/ML IV BOLUS
INTRAVENOUS | Status: DC | PRN
  Administered 2012-12-20: 200 mg via INTRAVENOUS

## 2012-12-20 MED ORDER — TRAMADOL HCL 50 MG PO TABS: 50.0000 mg | ORAL_TABLET | Freq: Four times a day (QID) | ORAL | Status: DC | PRN

## 2012-12-20 MED ORDER — CHLORHEXIDINE GLUCONATE 4 % EX LIQD: 1.0000 "application " | Freq: Once | CUTANEOUS | Status: DC

## 2012-12-20 MED ORDER — LACTATED RINGERS IV SOLN
INTRAVENOUS | Status: DC
  Administered 2012-12-20 (×2): via INTRAVENOUS

## 2012-12-20 MED ORDER — LIDOCAINE HCL (CARDIAC) 20 MG/ML IV SOLN
INTRAVENOUS | Status: DC | PRN
  Administered 2012-12-20: 80 mg via INTRAVENOUS

## 2012-12-20 MED ORDER — OXYCODONE HCL 5 MG PO TABS
5.0000 mg | ORAL_TABLET | Freq: Once | ORAL | Status: AC | PRN
  Administered 2012-12-20: 5 mg via ORAL

## 2012-12-20 MED ORDER — HYDROMORPHONE HCL PF 1 MG/ML IJ SOLN
0.2500 mg | INTRAMUSCULAR | Status: DC | PRN
  Administered 2012-12-20: 0.25 mg via INTRAVENOUS

## 2012-12-20 MED ORDER — FENTANYL CITRATE 0.05 MG/ML IJ SOLN: 50.0000 ug | INTRAMUSCULAR | Status: DC | PRN

## 2012-12-20 MED ORDER — DEXAMETHASONE SODIUM PHOSPHATE 4 MG/ML IJ SOLN
INTRAMUSCULAR | Status: DC | PRN
  Administered 2012-12-20: 8 mg via INTRAVENOUS

## 2012-12-20 SURGICAL SUPPLY — 46 items
ADH SKN CLS APL DERMABOND .7 (GAUZE/BANDAGES/DRESSINGS) ×1
BLADE SURG 10 STRL SS (BLADE) ×2 IMPLANT
BLADE SURG 15 STRL LF DISP TIS (BLADE) ×1 IMPLANT
BLADE SURG 15 STRL SS (BLADE) ×2
CANISTER SUCTION 1200CC (MISCELLANEOUS) IMPLANT
CHLORAPREP W/TINT 26ML (MISCELLANEOUS) ×2 IMPLANT
CLOTH BEACON ORANGE TIMEOUT ST (SAFETY) ×2 IMPLANT
COVER MAYO STAND STRL (DRAPES) ×2 IMPLANT
COVER TABLE BACK 60X90 (DRAPES) ×2 IMPLANT
DECANTER SPIKE VIAL GLASS SM (MISCELLANEOUS) IMPLANT
DERMABOND ADVANCED (GAUZE/BANDAGES/DRESSINGS) ×1
DERMABOND ADVANCED .7 DNX12 (GAUZE/BANDAGES/DRESSINGS) ×1 IMPLANT
DRAPE PED LAPAROTOMY (DRAPES) ×2 IMPLANT
DRAPE UTILITY XL STRL (DRAPES) ×2 IMPLANT
ELECT COATED BLADE 2.86 ST (ELECTRODE) ×2 IMPLANT
ELECT REM PT RETURN 9FT ADLT (ELECTROSURGICAL) ×2
ELECTRODE REM PT RTRN 9FT ADLT (ELECTROSURGICAL) ×1 IMPLANT
GAUZE SPONGE 4X4 16PLY XRAY LF (GAUZE/BANDAGES/DRESSINGS) IMPLANT
GLOVE BIO SURGEON STRL SZ7.5 (GLOVE) ×2 IMPLANT
GOWN PREVENTION PLUS XLARGE (GOWN DISPOSABLE) ×2 IMPLANT
NDL HYPO 25X1 1.5 SAFETY (NEEDLE) IMPLANT
NEEDLE HYPO 25X1 1.5 SAFETY (NEEDLE) IMPLANT
NS IRRIG 1000ML POUR BTL (IV SOLUTION) ×2 IMPLANT
PACK BASIN DAY SURGERY FS (CUSTOM PROCEDURE TRAY) ×2 IMPLANT
PENCIL BUTTON HOLSTER BLD 10FT (ELECTRODE) ×2 IMPLANT
SLEEVE SCD COMPRESS KNEE MED (MISCELLANEOUS) ×2 IMPLANT
SPONGE LAP 18X18 X RAY DECT (DISPOSABLE) ×2 IMPLANT
SUT CHROMIC 3 0 SH 27 (SUTURE) IMPLANT
SUT ETHILON 3 0 PS 1 (SUTURE) IMPLANT
SUT MON AB 4-0 PC3 18 (SUTURE) IMPLANT
SUT PROLENE 3 0 PS 2 (SUTURE) IMPLANT
SUT SILK 2 0 FS (SUTURE) IMPLANT
SUT VIC AB 3-0 54X BRD REEL (SUTURE) IMPLANT
SUT VIC AB 3-0 BRD 54 (SUTURE)
SUT VIC AB 3-0 FS2 27 (SUTURE) IMPLANT
SUT VIC AB 3-0 SH 27 (SUTURE)
SUT VIC AB 3-0 SH 27X BRD (SUTURE) IMPLANT
SUT VIC AB 4-0 RB1 27 (SUTURE)
SUT VIC AB 4-0 RB1 27X BRD (SUTURE) IMPLANT
SUT VICRYL 4-0 PS2 18IN ABS (SUTURE) IMPLANT
SUT VICRYL AB 3 0 TIES (SUTURE) IMPLANT
SYR CONTROL 10ML LL (SYRINGE) IMPLANT
TOWEL OR 17X24 6PK STRL BLUE (TOWEL DISPOSABLE) ×4 IMPLANT
TOWEL OR NON WOVEN STRL DISP B (DISPOSABLE) ×2 IMPLANT
TUBE CONNECTING 20X1/4 (TUBING) IMPLANT
YANKAUER SUCT BULB TIP NO VENT (SUCTIONS) IMPLANT

## 2012-12-20 NOTE — H&P (View-Only) (Signed)
Subjective:     Patient ID: Sarah Huber, female   DOB: 06/19/1956, 56 y.o.   MRN: [id number]  HPI The patient is a 56 -year-old white female who first noticed a mass in the left axilla around November. The mass is causing her discomfort and some numbness down her left arm. It is gradually getting larger. She is very concerned about it would like to have it removed. 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  Review of Systems  Constitutional: Negative.   HENT: Negative.   Eyes: Negative.   Respiratory: Negative.   Cardiovascular: Negative.   Gastrointestinal: Negative.   Endocrine: Negative.   Genitourinary: Negative.   Musculoskeletal: Negative.   Skin: Negative.   Allergic/Immunologic: Negative.   Neurological: Negative.   Hematological: Negative.   Psychiatric/Behavioral: Negative.        Objective:   Physical Exam  Constitutional: She is oriented to person, place, and time. She appears well-developed and well-nourished.  HENT:  Head: Normocephalic and atraumatic.  Eyes: Conjunctivae and EOM are normal. Pupils are equal, round, and reactive to light.  Neck: Normal range of motion. Neck supple.  Cardiovascular: Normal rate, regular rhythm and normal heart sounds.   Pulmonary/Chest: Effort normal and breath sounds normal.  Abdominal: Soft. Bowel sounds are normal.  Musculoskeletal: Normal range of motion.  There is some fatty prominence in the left axilla  Neurological: She is alert and oriented to person, place, and time.  Skin: Skin is warm and dry.  Psychiatric: She has a normal mood and affect. Her behavior is normal.       Assessment:     The patient has what appears to be a small lipoma in the left axilla which is causing her some discomfort. Because of this I think it would be reasonable to remove it. I have  discussed with her in detail the risks and benefits of the operation to do this as well as some of the technical aspects and she understands and wishes to proceed. She understands that removing this is not guaranteed to relieve her discomfort or numbness in her arm.     Plan:     Plan to excise mass from the left axilla       

## 2012-12-20 NOTE — Transfer of Care (Signed)
Immediate Anesthesia Transfer of Care Note  Patient: Sarah Huber  Procedure(s) Performed: Procedure(s): EXCISION MASS LEFT AXILLA (Left)  Patient Location: PACU  Anesthesia Type:General  Level of Consciousness: awake, alert , oriented and patient cooperative  Airway & Oxygen Therapy: Patient Spontanous Breathing and Patient connected to face mask oxygen  Post-op Assessment: Report given to PACU RN and Post -op Vital signs reviewed and stable  Post vital signs: Reviewed and stable  Complications: No apparent anesthesia complications

## 2012-12-20 NOTE — Interval H&P Note (Signed)
History and Physical Interval Note:  12/20/2012 12:32 PM  Sarah Huber  has presented today for surgery, with the diagnosis of left axilla mass  The various methods of treatment have been discussed with the patient and family. After consideration of risks, benefits and other options for treatment, the patient has consented to  Procedure(s): EXCISION MASS (Left) as a surgical intervention .  The patient's history has been reviewed, patient examined, no change in status, stable for surgery.  I have reviewed the patient's chart and labs.  Questions were answered to the patient's satisfaction.     TOTH III,Trammell Bowden S

## 2012-12-20 NOTE — Anesthesia Preprocedure Evaluation (Signed)
Anesthesia Evaluation  Patient identified by MRN, date of birth, ID band Patient awake    Reviewed: Allergy & Precautions, H&P , NPO status , Patient's Chart, lab work & pertinent test results  Airway Mallampati: I TM Distance: >3 FB Neck ROM: Full    Dental  (+) Teeth Intact and Dental Advisory Given   Pulmonary  breath sounds clear to auscultation        Cardiovascular Rhythm:Regular Rate:Normal     Neuro/Psych    GI/Hepatic   Endo/Other    Renal/GU      Musculoskeletal   Abdominal   Peds  Hematology   Anesthesia Other Findings "Small airway" according to pt.  6.0 ETT used previously, with Glidescope. 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  No particular problems with swallowing.  Reproductive/Obstetrics                           Anesthesia Physical Anesthesia Plan  ASA: II  Anesthesia Plan: General   Post-op Pain Management:    Induction: Intravenous  Airway Management Planned: LMA  Additional Equipment:   Intra-op Plan:   Post-operative Plan: Extubation in OR  Informed Consent: I have reviewed the patients History and Physical, chart, labs and discussed the procedure including the risks, benefits and alternatives for the proposed anesthesia with the patient or authorized representative who has indicated his/her understanding and acceptance.   Dental advisory given  Plan Discussed with: CRNA, Surgeon and Anesthesiologist  Anesthesia Plan Comments:         Anesthesia Quick Evaluation

## 2012-12-20 NOTE — Op Note (Signed)
12/20/2012  1:53 PM  PATIENT:  Sarah Huber  57 y.o. female  PRE-OPERATIVE DIAGNOSIS:  left axilla mass  POST-OPERATIVE DIAGNOSIS:  LEFT AXILLA MASS  PROCEDURE:  Procedure(s): EXCISION MASS LEFT AXILLA (Left)  SURGEON:  Surgeon(s) and Role:    * Robyne Askew, MD - Primary  PHYSICIAN ASSISTANT:   ASSISTANTS: none   ANESTHESIA:   general  EBL:  Total I/O In: 200 [I.V.:200] Out: -   BLOOD ADMINISTERED:none  DRAINS: none   LOCAL MEDICATIONS USED:  MARCAINE     SPECIMEN:  Source of Specimen:  left axillary fat  DISPOSITION OF SPECIMEN:  PATHOLOGY  COUNTS:  YES  TOURNIQUET:  * No tourniquets in log *  DICTATION: .Dragon Dictation After informed consent was obtained the patient was brought to the operating room and placed in the supine position on the operating table. After adequate induction of general anesthesia the patient's left axilla was prepped with ChloraPrep, allowed to dry, and draped in usual sterile manner. There was a small fatty bulge in the upper left axilla. A small incision was made with a 15 blade knife overlying this fatty mass. The incision was carried through the skin into the subcutaneous tissue sharply with the electrocautery. The fat in this area was excised sharply with the electrocautery. I then palpated the axilla and could feel the pectoralis and the latissimus muscles. There was no palpable lymphadenopathy. There was no other palpable mass in the axilla. The fat that was excised was sent to pathology for further evaluation. The wound was then infiltrated with quarter percent Marcaine. The deep layer the wound was closed with interrupted 3-0 Vicryl stitches. The skin was then closed with interrupted 4 Monocryl subcuticular stitches. Dermabond dressing was applied. The patient tolerated the procedure well. At the end of the case all needle sponge instrument counts are correct. The patient was then awakened and taken to recovery in stable  condition.  PLAN OF CARE: Discharge to home after PACU  PATIENT DISPOSITION:  PACU - hemodynamically stable.   Delay start of Pharmacological VTE agent (>24hrs) due to surgical blood loss or risk of bleeding: not applicable

## 2012-12-20 NOTE — Anesthesia Procedure Notes (Signed)
Procedure Name: LMA Insertion Date/Time: 12/20/2012 1:15 PM Performed by: Verlan Friends Pre-anesthesia Checklist: Patient identified, Emergency Drugs available, Suction available, Patient being monitored and Timeout performed Patient Re-evaluated:Patient Re-evaluated prior to inductionOxygen Delivery Method: Circle System Utilized Preoxygenation: Pre-oxygenation with 100% oxygen Intubation Type: IV induction Ventilation: Mask ventilation without difficulty LMA: LMA inserted LMA Size: 4.0 Number of attempts: 1 Airway Equipment and Method: bite block Placement Confirmation: positive ETCO2 Tube secured with: Tape (paper tape used) Dental Injury: Teeth and Oropharynx as per pre-operative assessment

## 2012-12-21 ENCOUNTER — Encounter (HOSPITAL_BASED_OUTPATIENT_CLINIC_OR_DEPARTMENT_OTHER): Payer: Self-pay | Admitting: General Surgery

## 2012-12-21 NOTE — Anesthesia Postprocedure Evaluation (Signed)
  Anesthesia Post-op Note  Patient: Sarah Huber  Procedure(s) Performed: Procedure(s): EXCISION MASS LEFT AXILLA (Left)  Patient Location: PACU  Anesthesia Type:General  Level of Consciousness: awake, alert  and oriented  Airway and Oxygen Therapy: Patient Spontanous Breathing  Post-op Pain: mild  Post-op Assessment: Post-op Vital signs reviewed  Post-op Vital Signs: Reviewed  Complications: No apparent anesthesia complications

## 2012-12-27 ENCOUNTER — Ambulatory Visit (INDEPENDENT_AMBULATORY_CARE_PROVIDER_SITE_OTHER): Payer: BC Managed Care – PPO | Admitting: Surgery

## 2012-12-27 ENCOUNTER — Encounter (INDEPENDENT_AMBULATORY_CARE_PROVIDER_SITE_OTHER): Payer: Self-pay | Admitting: Surgery

## 2012-12-27 VITALS — BP 128/82 | HR 92 | Resp 16 | Ht 66.0 in | Wt 149.0 lb

## 2012-12-27 DIAGNOSIS — IMO0001 Reserved for inherently not codable concepts without codable children: Secondary | ICD-10-CM

## 2012-12-27 DIAGNOSIS — IMO0002 Reserved for concepts with insufficient information to code with codable children: Secondary | ICD-10-CM

## 2012-12-27 MED ORDER — TRAMADOL HCL 50 MG PO TABS: 50.0000 mg | ORAL_TABLET | Freq: Four times a day (QID) | ORAL | Status: DC | PRN

## 2012-12-27 MED ORDER — OXYCODONE-ACETAMINOPHEN 5-325 MG PO TABS: 1.0000 | ORAL_TABLET | ORAL | Status: DC | PRN

## 2012-12-27 NOTE — Progress Notes (Signed)
Postop visit for Sarah Huber who came to the urgent office today with pain in her left axilla where she underwent a lipoma excision by Dr. Carolynne Edouard on 12/20/2012. Copy of the path was given to her.    On exam her left axilla was swollen slightly smaller than a racquetball but was not red and there was no drainage.. The area was prepped with chlorhexidine and non-with 2 cc of lidocaine and then aspirated. Aspiration revealed 25 cc of yellow serous fluid. It was non-cloudy and appeared to be a noninfected seroma. Patient was complaining of numbness in the medial aspect of her arm that occurred after surgery. It did not appear to be made better with this aspiration.   The patient will be followed up in about a week. I told her that she may have had this aspirated again. She requested refills on her pain and refills for the [facility] and Percocet were submitted.

## 2012-12-27 NOTE — Patient Instructions (Signed)
It appears that you have a postop seroma.  It may need to be re-aspirated.

## 2012-12-28 ENCOUNTER — Other Ambulatory Visit (INDEPENDENT_AMBULATORY_CARE_PROVIDER_SITE_OTHER): Payer: Self-pay | Admitting: Surgery

## 2012-12-28 ENCOUNTER — Other Ambulatory Visit (INDEPENDENT_AMBULATORY_CARE_PROVIDER_SITE_OTHER): Payer: Self-pay

## 2012-12-28 ENCOUNTER — Ambulatory Visit (INDEPENDENT_AMBULATORY_CARE_PROVIDER_SITE_OTHER): Payer: BC Managed Care – PPO | Admitting: Surgery

## 2012-12-28 ENCOUNTER — Encounter (INDEPENDENT_AMBULATORY_CARE_PROVIDER_SITE_OTHER): Payer: Self-pay | Admitting: Surgery

## 2012-12-28 VITALS — BP 122/80 | HR 80 | Temp 97.6°F | Resp 16 | Ht 66.0 in | Wt 147.0 lb

## 2012-12-28 DIAGNOSIS — IMO0001 Reserved for inherently not codable concepts without codable children: Secondary | ICD-10-CM

## 2012-12-28 DIAGNOSIS — Z5189 Encounter for other specified aftercare: Secondary | ICD-10-CM

## 2012-12-28 NOTE — Progress Notes (Signed)
25 cc seroma aspirated.  She states that her back upper arm in the medial aspect have been feeling real bruised and hurting since surgery particularly since his things begin swelling. On entering culture the seroma today and put a bulky dressing over the area of aspiration. Since she has numerous allergies to antibiotics and since she did not really want to take antibiotics if at all possible we will await to see if this grows out anything before committing to antibiotics.  We'll see back at her previously scheduled appointment next week.

## 2012-12-29 ENCOUNTER — Encounter (INDEPENDENT_AMBULATORY_CARE_PROVIDER_SITE_OTHER): Payer: Self-pay | Admitting: Surgery

## 2012-12-29 ENCOUNTER — Ambulatory Visit (INDEPENDENT_AMBULATORY_CARE_PROVIDER_SITE_OTHER): Payer: BC Managed Care – PPO | Admitting: Surgery

## 2012-12-29 VITALS — BP 112/74 | HR 97 | Temp 98.8°F | Resp 18 | Ht 66.0 in | Wt 145.6 lb

## 2012-12-29 DIAGNOSIS — Z5189 Encounter for other specified aftercare: Secondary | ICD-10-CM

## 2012-12-29 DIAGNOSIS — IMO0001 Reserved for inherently not codable concepts without codable children: Secondary | ICD-10-CM

## 2012-12-29 NOTE — Progress Notes (Signed)
Sarah Huber 57 y.o.  Body mass index is 23.51 kg/(m^2).  Patient Active Problem List   Diagnosis Date Noted  . Seroma complicating a procedure 12/27/2012  . Lipoma of axilla left 11/17/2012    Allergies  Allergen Reactions  . Adhesive (Tape) Other (See Comments)    BLISTERS  . Hydrocodone Other (See Comments)    SEVERE HEADACHE  . Morphine And Related Other (See Comments)    SEVERE HEADACHE  . Aspirin Palpitations  . Penicillins Palpitations    Past Surgical History  Procedure Laterality Date  . Appendectomy    . Thyroidectomy    . Splenectomy, total    . Lipoma excision Left 10/19/2001    groin  . Esophagogastric fundoplasty  12/09/2009    laparoscopic takedown and mobilization of foregut  . Hiatal hernia repair  12/09/2009  . Nissen fundoplication      x 3  . Laparoscopic lysis of adhesions  01/27/2011    resection of scar tissue; wound exploration  . Total abdominal hysterectomy w/ bilateral salpingoophorectomy  07/30/2010  . Other surgical history  2004 - 2010    vocal cord surgery x 6  . Mass excision Left 12/20/2012    Procedure: EXCISION MASS LEFT AXILLA;  Surgeon: Robyne Askew, MD;  Location: [facility];  Service: General;  Laterality: Left;   TODD,[name], FNP No diagnosis found.  Sarah Huber Returns today with pain and a swollen axilla again. I aspirated 25 cc of straw-colored fluid from the site and put a pressure dressing on it. This may need to be aspirated again on Monday. Matt B. Daphine Deutscher, MD, [facility] [phone] beeper [phone]  12/29/2012 3:36 PM

## 2012-12-29 NOTE — Patient Instructions (Addendum)
Thanks for your patience.  If you need further assistance after leaving the office, please call our office and speak with a [facility] nurse.  [phone].  If you want to leave a message for Dr. Daphine Deutscher, please call his office phone at [phone].  [phone] cell

## 2012-12-31 LAB — WOUND CULTURE
Gram Stain: NONE SEEN
Gram Stain: NONE SEEN

## 2013-01-01 ENCOUNTER — Encounter (INDEPENDENT_AMBULATORY_CARE_PROVIDER_SITE_OTHER): Payer: Self-pay | Admitting: Surgery

## 2013-01-01 ENCOUNTER — Ambulatory Visit (INDEPENDENT_AMBULATORY_CARE_PROVIDER_SITE_OTHER): Payer: BC Managed Care – PPO | Admitting: Surgery

## 2013-01-01 VITALS — BP 136/84 | HR 84 | Temp 98.0°F | Resp 20 | Ht 66.0 in | Wt 147.4 lb

## 2013-01-01 DIAGNOSIS — Z5189 Encounter for other specified aftercare: Secondary | ICD-10-CM

## 2013-01-01 DIAGNOSIS — IMO0001 Reserved for inherently not codable concepts without codable children: Secondary | ICD-10-CM

## 2013-01-01 MED ORDER — TRAMADOL HCL 50 MG PO TABS: 50.0000 mg | ORAL_TABLET | Freq: Four times a day (QID) | ORAL | Status: DC | PRN

## 2013-01-01 MED ORDER — LEVOFLOXACIN 500 MG PO TABS: 500.0000 mg | ORAL_TABLET | Freq: Every day | ORAL | Status: AC

## 2013-01-01 MED ORDER — OXYCODONE-ACETAMINOPHEN 7.5-325 MG PO TABS: 1.0000 | ORAL_TABLET | ORAL | Status: DC | PRN

## 2013-01-01 NOTE — Patient Instructions (Signed)
Take Levaquin daily for 7 days.

## 2013-01-01 NOTE — Progress Notes (Signed)
Mr. And Mrs. Sabey came in this am after seroma filled up by Saturday.  I spoke with her on the phone on Saturday and Sunday.   After prepping with chlorprep and infiltrating with lidocaine and neut, I inserted a seroma cath and sutured it in place with 4-0 nylon.  About 20 cc was retrieved.  Tagaderm dressing was applied.  Refills given because patient has been having a lot of pain in her arm.  Advance to 7.5 mg oxycodone.  Levaquin 500 qd ordered since she cannot take many antibiotics.    Will see back this week and access drainage.  Husband shown how to empty drain.

## 2013-01-04 ENCOUNTER — Ambulatory Visit (INDEPENDENT_AMBULATORY_CARE_PROVIDER_SITE_OTHER): Payer: BC Managed Care – PPO | Admitting: Surgery

## 2013-01-04 ENCOUNTER — Encounter (INDEPENDENT_AMBULATORY_CARE_PROVIDER_SITE_OTHER): Payer: Self-pay | Admitting: Surgery

## 2013-01-04 VITALS — BP 128/90 | HR 104 | Temp 98.8°F | Resp 20 | Ht 66.0 in | Wt 145.4 lb

## 2013-01-04 DIAGNOSIS — IMO0001 Reserved for inherently not codable concepts without codable children: Secondary | ICD-10-CM

## 2013-01-04 DIAGNOSIS — Z5189 Encounter for other specified aftercare: Secondary | ICD-10-CM

## 2013-01-04 MED ORDER — TRAMADOL HCL 50 MG PO TABS: 50.0000 mg | ORAL_TABLET | Freq: Four times a day (QID) | ORAL | Status: DC | PRN

## 2013-01-04 MED ORDER — OXYCODONE-ACETAMINOPHEN 7.5-325 MG PO TABS: 1.0000 | ORAL_TABLET | ORAL | Status: DC | PRN

## 2013-01-04 NOTE — Progress Notes (Signed)
Continuing to  record drainage from her axillary drain. Averaging about 20 cc a day.  Swelling in the arm may represent lymphedema and this may represent a lymphatic fistula.  She doesn' want to see Dr. Carolynne Edouard next Tuesday so I will make arrangements to come over and see her on Monday.

## 2013-01-04 NOTE — Patient Instructions (Signed)
Continue to drain and record axillary drainage.

## 2013-01-08 ENCOUNTER — Encounter (INDEPENDENT_AMBULATORY_CARE_PROVIDER_SITE_OTHER): Payer: Self-pay | Admitting: Surgery

## 2013-01-08 ENCOUNTER — Ambulatory Visit (INDEPENDENT_AMBULATORY_CARE_PROVIDER_SITE_OTHER): Payer: BC Managed Care – PPO | Admitting: Surgery

## 2013-01-08 VITALS — BP 130/84 | HR 88 | Temp 98.2°F | Resp 18 | Ht 66.0 in | Wt 144.2 lb

## 2013-01-08 DIAGNOSIS — Z5189 Encounter for other specified aftercare: Secondary | ICD-10-CM

## 2013-01-08 DIAGNOSIS — IMO0001 Reserved for inherently not codable concepts without codable children: Secondary | ICD-10-CM

## 2013-01-08 NOTE — Progress Notes (Signed)
Sarah Huber 57 y.o.  Body mass index is 23.29 kg/(m^2).  Patient Active Problem List   Diagnosis Date Noted  . Seroma complicating a procedure 12/27/2012  . Lipoma of axilla left 11/17/2012    Allergies  Allergen Reactions  . Adhesive (Tape) Other (See Comments)    BLISTERS  . Hydrocodone Other (See Comments)    SEVERE HEADACHE  . Morphine And Related Other (See Comments)    SEVERE HEADACHE  . Aspirin Palpitations  . Penicillins Palpitations    Past Surgical History  Procedure Laterality Date  . Appendectomy    . Thyroidectomy    . Splenectomy, total    . Lipoma excision Left 10/19/2001    groin  . Esophagogastric fundoplasty  12/09/2009    laparoscopic takedown and mobilization of foregut  . Hiatal hernia repair  12/09/2009  . Nissen fundoplication      x 3  . Laparoscopic lysis of adhesions  01/27/2011    resection of scar tissue; wound exploration  . Total abdominal hysterectomy w/ bilateral salpingoophorectomy  07/30/2010  . Other surgical history  2004 - 2010    vocal cord surgery x 6  . Mass excision Left 12/20/2012    Procedure: EXCISION MASS LEFT AXILLA;  Surgeon: Robyne Askew, MD;  Location: [facility];  Service: General;  Laterality: Left;   TODD,[name], FNP No diagnosis found.  [name] called me this past Saturday and told me that her upper arm was swollen like a sausage.  I offered her to come to the [facility] but she declined and wanted to see me on Monday.  She did wrap her arm with an ace wrap.   She continues to have about 20 cc/day from the seromacath but today it appears to be occluded.  We discussed this and removed it.  I explained that this could be a lymphatic leak and that hopefully it would resolve and the swelling in her arm could be lymphatics.  I doubt that lymphazure injection and mapping would be of any benefit but continued watchful waiting to see is this will resolve on its own.   Matt B. Daphine Deutscher, MD, [facility] [phone] beeper [phone]  01/08/2013 10:25 AM

## 2013-01-08 NOTE — Patient Instructions (Signed)
Drainage may occur along the site of prior seroma cath. Continue to use the ace wrap and take pain meds as needed.

## 2013-01-09 ENCOUNTER — Encounter (INDEPENDENT_AMBULATORY_CARE_PROVIDER_SITE_OTHER): Payer: BC Managed Care – PPO | Admitting: General Surgery

## 2013-01-10 ENCOUNTER — Encounter (INDEPENDENT_AMBULATORY_CARE_PROVIDER_SITE_OTHER): Payer: Self-pay | Admitting: Surgery

## 2013-01-10 ENCOUNTER — Ambulatory Visit (INDEPENDENT_AMBULATORY_CARE_PROVIDER_SITE_OTHER): Payer: BC Managed Care – PPO | Admitting: Surgery

## 2013-01-10 VITALS — BP 122/64 | HR 92 | Temp 98.1°F | Resp 16 | Ht 66.0 in | Wt 145.0 lb

## 2013-01-10 DIAGNOSIS — Z5189 Encounter for other specified aftercare: Secondary | ICD-10-CM

## 2013-01-10 DIAGNOSIS — IMO0001 Reserved for inherently not codable concepts without codable children: Secondary | ICD-10-CM

## 2013-01-10 MED ORDER — OXYCODONE-ACETAMINOPHEN 7.5-325 MG PO TABS: 1.0000 | ORAL_TABLET | ORAL | Status: AC | PRN

## 2013-01-10 MED ORDER — TRAMADOL HCL 50 MG PO TABS: 50.0000 mg | ORAL_TABLET | Freq: Four times a day (QID) | ORAL | Status: DC | PRN

## 2013-01-10 NOTE — Progress Notes (Signed)
Sarah Huber 57 y.o.  Body mass index is 23.41 kg/(m^2).  Patient Active Problem List   Diagnosis Date Noted  . Seroma complicating a procedure 12/27/2012  . Lipoma of axilla left 11/17/2012    Allergies  Allergen Reactions  . Adhesive (Tape) Other (See Comments)    BLISTERS  . Hydrocodone Other (See Comments)    SEVERE HEADACHE  . Morphine And Related Other (See Comments)    SEVERE HEADACHE  . Aspirin Palpitations  . Penicillins Palpitations    Past Surgical History  Procedure Laterality Date  . Appendectomy    . Thyroidectomy    . Splenectomy, total    . Lipoma excision Left 10/19/2001    groin  . Esophagogastric fundoplasty  12/09/2009    laparoscopic takedown and mobilization of foregut  . Hiatal hernia repair  12/09/2009  . Nissen fundoplication      x 3  . Laparoscopic lysis of adhesions  01/27/2011    resection of scar tissue; wound exploration  . Total abdominal hysterectomy w/ bilateral salpingoophorectomy  07/30/2010  . Other surgical history  2004 - 2010    vocal cord surgery x 6  . Mass excision Left 12/20/2012    Procedure: EXCISION MASS LEFT AXILLA;  Surgeon: Robyne Askew, MD;  Location: [facility];  Service: General;  Laterality: Left;   TODD,[name], FNP No diagnosis found.  Drainage stopped last night.  Slight swelling but not as great as before.  Continue observation.  Return Friday.  She requested a refill on Toradol and Percocet.   Matt B. Daphine Deutscher, MD, [facility] [phone] beeper [phone]  01/10/2013 1:57 PM

## 2013-01-10 NOTE — Patient Instructions (Signed)
Lymphedema Lymphedema is a swelling caused by the abnormal collection of lymph under the skin. The lymph is fluid from the tissues in your body that travels in the lymphatic system. This system is part of the immune system that includes lymph nodes and vessels. The lymph vessels collect and carry the excess fluid, fats, proteins, and wastes from the tissues of the body to the bloodstream. This system also works to clean and remove bacteria and waste products from the body.  Lymphedema occurs when the lymphatic system is blocked. When the lymph vessels or lymph nodes are blocked or damaged, lymph does not drain properly. This causes abnormal build up of lymph. This leads to swelling in the arms or legs. Lymphedema cannot be cured by medicines. But the swelling can be reduced by physical methods. CAUSES  There are two types of lymphedema. Primary lymphedema is caused by the absence or abnormality of the lymph vessel at birth. It is also known as inherited lymphedema, which occurs rarely. Secondary or acquired lymphedema occurs when the lymph vessel is damaged or blocked. The causes of lymph vessel blockage are:   Skin infection like cellulites.  Infection by parasites (filariasis).  Injury.  Cancer.  Radiation therapy.  Formation of scar tissue.  Surgery. SYMPTOMS  The symptoms of lymphedema are:  Abnormal swelling of the arm or leg.  Heavy or tight feeling in your arm or leg.  Tight-fitting shoes or rings.  Redness of skin over the affected area.  Limited movement of the affected limb.  Some patients complain about sensitivity to touch and discomfort in the limb(s) affected. You may not have these symptoms immediately following injury. They usually appear within a few days or even years after injury. Inform your caregiver, if you have any of these symptoms. Early treatment can avoid further problems.  DIAGNOSIS  First, your caregiver will inquire about any surgery you have had or  medicines you are taking. He will then examine you. Your caregiver may order special imaging tests, such as:  Lymphoscintigraphy (a test in which a low dose of radioactive substance is injected to trace the flow of lymph through the lymph vessels).  MRI (imaging tests using magnetic fields).  Computed tomography (test using special cross-sectional X-rays).  Duplex ultrasound (test using high-frequency sound waves to show the vessels and the blood flow on a screen).  Lymphangiography (special X-ray taken after injecting a contrast dye into the lymph vessel). It is now rarely done. TREATMENT  Lymphedema can be treated in different ways. Your caregiver will decide the type of treatment depending on the cause. Treatment may include:  Exercise: Special exercises will help fluid move out easily from the affected part. This should be done as per your caregiver's advice.  Manual lymph drainage: Gentle massage of the affected limb makes the fluid to move out more freely.  Compression: Compression stockings or external pump apply pressure over the affected limb. This helps the fluid to move out from the arm or leg. Bandaging can also help to move the fluid out from the affected part. Your caregiver will decide the method that suits you the best.  Medicines: Your caregiver may prescribe antibiotics, if you have infection.  Surgery: Your caregiver may advise surgery for severe lymphedema. It is reserved for special cases when the patient has difficulty moving. Your surgeon may remove excess tissue from the arm or leg. This will help to ease your movement. Physical therapy may have to be continued after surgery. HOME CARE INSTRUCTIONS    The area is very fragile and is predisposed to injury and infection.  Eat a healthy diet.  Exercise regularly as per advice.  Keep the affected area clean and dry.  Use gloves while cooking or gardening.  Protect your skin from cuts.  Use electric razor to  shave the affected area.  Keep affected limb elevated.  Do not wear tight clothes, shoes, or jewelry as it may cause the tissue to be strangled.  Do not use heat pads over the affected area.  Do not sit with cross legs.  Do not walk barefoot.  Do not carry weight on the affected arm.  Avoid having blood pressure checked on the affected limb. SEEK MEDICAL CARE IF:  You continue to have swelling in your limb. SEEK IMMEDIATE MEDICAL CARE IF:   You have high fever.  You have skin rash.  You have chills or sweats.  You have pain or redness.  You have a cut that does not heal. MAKE SURE YOU:   Understand these instructions.  Will watch your condition.  Will get help right away if you are not doing well or get worse. Document Released: 06/06/2007 Document Revised: 07/26/2012 Document Reviewed: 05/12/2009 [employer] Patient Information 2014 [employer].  

## 2013-01-12 ENCOUNTER — Encounter (INDEPENDENT_AMBULATORY_CARE_PROVIDER_SITE_OTHER): Payer: Self-pay | Admitting: Surgery

## 2013-01-12 ENCOUNTER — Ambulatory Visit (INDEPENDENT_AMBULATORY_CARE_PROVIDER_SITE_OTHER): Payer: BC Managed Care – PPO | Admitting: Surgery

## 2013-01-12 VITALS — BP 124/82 | HR 80 | Temp 99.3°F | Resp 16 | Ht 66.0 in | Wt 146.2 lb

## 2013-01-12 DIAGNOSIS — Z5189 Encounter for other specified aftercare: Secondary | ICD-10-CM

## 2013-01-12 DIAGNOSIS — IMO0001 Reserved for inherently not codable concepts without codable children: Secondary | ICD-10-CM

## 2013-01-12 NOTE — Progress Notes (Signed)
Sarah Huber 57 y.o.  Body mass index is 23.61 kg/(m^2).  Patient Active Problem List   Diagnosis Date Noted  . Seroma complicating a procedure 12/27/2012  . Lipoma of axilla left 11/17/2012    Allergies  Allergen Reactions  . Adhesive (Tape) Other (See Comments)    BLISTERS  . Hydrocodone Other (See Comments)    SEVERE HEADACHE  . Morphine And Related Other (See Comments)    SEVERE HEADACHE  . Aspirin Palpitations  . Penicillins Palpitations    Past Surgical History  Procedure Laterality Date  . Appendectomy    . Thyroidectomy    . Splenectomy, total    . Lipoma excision Left 10/19/2001    groin  . Esophagogastric fundoplasty  12/09/2009    laparoscopic takedown and mobilization of foregut  . Hiatal hernia repair  12/09/2009  . Nissen fundoplication      x 3  . Laparoscopic lysis of adhesions  01/27/2011    resection of scar tissue; wound exploration  . Total abdominal hysterectomy w/ bilateral salpingoophorectomy  07/30/2010  . Other surgical history  2004 - 2010    vocal cord surgery x 6  . Mass excision Left 12/20/2012    Procedure: EXCISION MASS LEFT AXILLA;  Surgeon: Robyne Askew, MD;  Location: [facility];  Service: General;  Laterality: Left;   TODD,[name], FNP No diagnosis found.  Pain in arm is better.  Swelling in the axilla is going down and firm scar tissue is present.  I think that she is improved.  Ace wrap reapplied.  Will see her next week after she calls me on Tuesday.   Matt B. Daphine Deutscher, MD, [facility] [phone] beeper [phone]  01/12/2013 4:56 PM

## 2013-01-12 NOTE — Patient Instructions (Signed)
Thanks for your patience.  If you need further assistance after leaving the office, please call our office and speak with a [facility] nurse.  [phone].  If you want to leave a message for Dr. Remon Quinto, please call his office phone at [phone]. 

## 2013-07-04 ENCOUNTER — Other Ambulatory Visit: Payer: Self-pay | Admitting: Obstetrics and Gynecology

## 2013-12-27 ENCOUNTER — Ambulatory Visit (INDEPENDENT_AMBULATORY_CARE_PROVIDER_SITE_OTHER): Payer: 59 | Admitting: Internal Medicine

## 2013-12-27 ENCOUNTER — Encounter: Payer: Self-pay | Admitting: Internal Medicine

## 2013-12-27 VITALS — BP 110/78 | HR 90 | Temp 98.1°F | Resp 18 | Ht 65.5 in | Wt 145.0 lb

## 2013-12-27 DIAGNOSIS — Z9889 Other specified postprocedural states: Secondary | ICD-10-CM

## 2013-12-27 DIAGNOSIS — C73 Malignant neoplasm of thyroid gland: Secondary | ICD-10-CM

## 2013-12-27 DIAGNOSIS — N6019 Diffuse cystic mastopathy of unspecified breast: Secondary | ICD-10-CM

## 2013-12-27 DIAGNOSIS — M8580 Other specified disorders of bone density and structure, unspecified site: Secondary | ICD-10-CM | POA: Insufficient documentation

## 2013-12-27 DIAGNOSIS — R922 Inconclusive mammogram: Secondary | ICD-10-CM

## 2013-12-27 DIAGNOSIS — E890 Postprocedural hypothyroidism: Secondary | ICD-10-CM

## 2013-12-27 DIAGNOSIS — J45901 Unspecified asthma with (acute) exacerbation: Secondary | ICD-10-CM | POA: Insufficient documentation

## 2013-12-27 DIAGNOSIS — J45909 Unspecified asthma, uncomplicated: Secondary | ICD-10-CM

## 2013-12-27 DIAGNOSIS — Z9089 Acquired absence of other organs: Secondary | ICD-10-CM

## 2013-12-27 DIAGNOSIS — E039 Hypothyroidism, unspecified: Secondary | ICD-10-CM

## 2013-12-27 DIAGNOSIS — Z90710 Acquired absence of both cervix and uterus: Secondary | ICD-10-CM

## 2013-12-27 DIAGNOSIS — Z90722 Acquired absence of ovaries, bilateral: Secondary | ICD-10-CM

## 2013-12-27 DIAGNOSIS — M949 Disorder of cartilage, unspecified: Secondary | ICD-10-CM

## 2013-12-27 DIAGNOSIS — Z9079 Acquired absence of other genital organ(s): Secondary | ICD-10-CM

## 2013-12-27 DIAGNOSIS — K219 Gastro-esophageal reflux disease without esophagitis: Secondary | ICD-10-CM

## 2013-12-27 DIAGNOSIS — J209 Acute bronchitis, unspecified: Secondary | ICD-10-CM

## 2013-12-27 DIAGNOSIS — M899 Disorder of bone, unspecified: Secondary | ICD-10-CM

## 2013-12-27 DIAGNOSIS — N6459 Other signs and symptoms in breast: Secondary | ICD-10-CM

## 2013-12-27 DIAGNOSIS — Z9081 Acquired absence of spleen: Secondary | ICD-10-CM

## 2013-12-27 LAB — COMPREHENSIVE METABOLIC PANEL
ALBUMIN: 4.2 g/dL (ref 3.5–5.2)
ALK PHOS: 62 U/L (ref 39–117)
ALT: 20 U/L (ref 0–35)
AST: 19 U/L (ref 0–37)
BUN: 17 mg/dL (ref 6–23)
CO2: 29 mEq/L (ref 19–32)
CREATININE: 0.8 mg/dL (ref 0.50–1.10)
Calcium: 9.1 mg/dL (ref 8.4–10.5)
Chloride: 99 mEq/L (ref 96–112)
GLUCOSE: 105 mg/dL — AB (ref 70–99)
POTASSIUM: 4.2 meq/L (ref 3.5–5.3)
Sodium: 139 mEq/L (ref 135–145)
Total Bilirubin: 0.2 mg/dL (ref 0.2–1.2)
Total Protein: 6.7 g/dL (ref 6.0–8.3)

## 2013-12-27 LAB — CBC WITH DIFFERENTIAL/PLATELET
BASOS ABS: 0.1 10*3/uL (ref 0.0–0.1)
BASOS PCT: 1 % (ref 0–1)
EOS ABS: 0.4 10*3/uL (ref 0.0–0.7)
EOS PCT: 4 % (ref 0–5)
HEMATOCRIT: 40.6 % (ref 36.0–46.0)
HEMOGLOBIN: 14 g/dL (ref 12.0–15.0)
Lymphocytes Relative: 45 % (ref 12–46)
Lymphs Abs: 4.4 10*3/uL — ABNORMAL HIGH (ref 0.7–4.0)
MCH: 32 pg (ref 26.0–34.0)
MCHC: 34.5 g/dL (ref 30.0–36.0)
MCV: 92.9 fL (ref 78.0–100.0)
MONO ABS: 2 10*3/uL — AB (ref 0.1–1.0)
MONOS PCT: 21 % — AB (ref 3–12)
NEUTROS ABS: 2.8 10*3/uL (ref 1.7–7.7)
Neutrophils Relative %: 29 % — ABNORMAL LOW (ref 43–77)
Platelets: 354 10*3/uL (ref 150–400)
RBC: 4.37 MIL/uL (ref 3.87–5.11)
RDW: 13.5 % (ref 11.5–15.5)
WBC: 9.7 10*3/uL (ref 4.0–10.5)

## 2013-12-27 LAB — LIPID PANEL
CHOL/HDL RATIO: 5 ratio
Cholesterol: 227 mg/dL — ABNORMAL HIGH (ref 0–200)
HDL: 45 mg/dL (ref >39)
LDL CALC: 126 mg/dL — AB (ref 0–99)
TRIGLYCERIDES: 282 mg/dL — AB (ref <150)
VLDL: 56 mg/dL — ABNORMAL HIGH (ref 0–40)

## 2013-12-27 MED ORDER — ALBUTEROL SULFATE (2.5 MG/3ML) 0.083% IN NEBU
2.5000 mg | INHALATION_SOLUTION | Freq: Once | RESPIRATORY_TRACT | Status: AC
  Administered 2013-12-27: 2.5 mg via RESPIRATORY_TRACT

## 2013-12-27 MED ORDER — ALBUTEROL SULFATE HFA 108 (90 BASE) MCG/ACT IN AERS: INHALATION_SPRAY | RESPIRATORY_TRACT | Status: AC

## 2013-12-27 MED ORDER — PREDNISONE 20 MG PO TABS: ORAL_TABLET | ORAL | Status: DC

## 2013-12-27 MED ORDER — AZITHROMYCIN 250 MG PO TABS: ORAL_TABLET | ORAL | Status: DC

## 2013-12-27 MED ORDER — METHYLPREDNISOLONE ACETATE 80 MG/ML IJ SUSP
160.0000 mg | Freq: Once | INTRAMUSCULAR | Status: AC
  Administered 2013-12-27: 160 mg via INTRAMUSCULAR

## 2013-12-27 NOTE — Patient Instructions (Addendum)
Sign to get Dr. Alyson Ingles records  GI  [facility]  Ok to refer To Dr. Tod Persia endocrine with [facility].    Schedule CPE  With me    To pharmacy and lab today   See me in 2 weeks or sooner prn

## 2013-12-27 NOTE — Progress Notes (Signed)
Subjective:    Patient ID: Sarah Huber, female    DOB: [birth year], 58 y.o.   MRN: [id number]  HPI  Sarah Huber is here first visit to establish primary care.  She is a former pt of mine.    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 Derrill Kay [facility] and is "tired of going there".  She is controlled with phenergan with meals.  She had complication of splenic abscess and is S/P splenectomy with resultant scar tissue.  She uses Elavil at night for this and is on phernergan for chronic nausea/  Sarah Huber has had a dry cough , nasal congestion, itchy eyes and some wheezing last several days.  She does report adult onset asthma but it has not flared in several years.  Lots of allergy symptoms this year  Allergies  Allergen Reactions  . Adhesive [Tape] Other (See Comments)    BLISTERS  . Hydrocodone Other (See Comments)    SEVERE HEADACHE  . Morphine And Related Other (See Comments)    SEVERE HEADACHE  . Aspirin Palpitations  . Penicillins Palpitations   Past Medical History  Diagnosis Date  . Headache(784.0)     migraines  . Seasonal allergies   . Personal history of asthma     "allergic asthma"  . History of hiatal hernia     repaired 2011  . GERD (gastroesophageal reflux disease)     occasionally; no current med.  . Hypothyroidism     s/p thyroidectomy  . Chronic abdominal pain     left side - states is scar tissue from previous surgeries  . Difficulty swallowing     food and pills  . 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 Complication of anesthesia     small airway; anesthesia awareness; states remembers things that occur during surgery  . Mass of axilla 11/2012    left  . Cancer     thyroid    Past Surgical History  Procedure Laterality Date  . Appendectomy    . Thyroidectomy    . Splenectomy, total    . Lipoma excision Left 10/19/2001    groin  . Esophagogastric fundoplasty  12/09/2009    laparoscopic takedown and mobilization of foregut  . Hiatal hernia repair  12/09/2009  . Nissen fundoplication      x 3  . Laparoscopic lysis of adhesions  01/27/2011    resection of scar tissue; wound exploration  . Total abdominal hysterectomy w/ bilateral salpingoophorectomy  07/30/2010  . Other surgical history  2004 - 2010    vocal cord surgery x 6  . Mass excision Left 12/20/2012    Procedure: EXCISION MASS LEFT AXILLA;  Surgeon: Merrie Roof, MD;  Location: [place];  Service: General;  Laterality: Left;   History   Social History  . Marital Status: Married    Spouse Name: N/A    Number of Children: N/A  . Years of Education: N/A   Occupational History  . Not on file.   Social History Main Topics  . Smoking status: Current Some Day Smoker  . Smokeless tobacco: Never Used     Comment: quit smoking 2010  . Alcohol Use: 0.0  oz/week     Comment: occasionally  . Drug Use: No  . Sexual Activity: Yes   Other Topics Concern  . Not on file   Social History Narrative  . No narrative on file   Family History  Problem Relation Age of Onset  . Thyroid cancer Sister   . Colon cancer Maternal Grandfather   . Cancer Maternal Grandfather 57    colon   . Anesthesia problems Mother     "hard time coming out"  . Hypertension Mother   . Hyperlipidemia Mother   . Alcohol abuse Father    Patient Active Problem List   Diagnosis Date Noted  . Asthma 12/27/2013  . Fibrocystic breast disease 12/27/2013  . S/P total hysterectomy and bilateral salpingo-oophorectomy 12/27/2013  .   Thyroid cancer  Dr. Tod Persia 12/27/2013  . GERD (gastroesophageal reflux disease) 12/27/2013  . Osteopenia 12/27/2013  . Hypothyroidism 12/27/2013  . Status post Nissen  fundoplication 67/59/1638  . Breast density 12/27/2013  . Seroma complicating a procedure 12/27/2012  . Lipoma of axilla left 11/17/2012   Current Outpatient Prescriptions on File Prior to Visit  Medication Sig Dispense Refill  . amitriptyline (ELAVIL) 50 MG tablet Take 50 mg by mouth at bedtime.      . butalbital-acetaminophen-caffeine (FIORICET, ESGIC) 50-325-40 MG per tablet Take 1 tablet by mouth 2 (two) times daily as needed for headache.      . calcium carbonate (OS-CAL) 600 MG TABS Take 600 mg by mouth 2 (two) times daily with a meal.      . cholecalciferol (VITAMIN D) 1000 UNITS tablet Take 1,000 Units by mouth daily.      Marland Kitchen conjugated estrogens (PREMARIN) vaginal cream Place vaginally daily. 3 x/week      . fish oil-omega-3 fatty acids 1000 MG capsule Take 2 g by mouth daily.      Marland Kitchen levothyroxine (SYNTHROID, LEVOTHROID) 175 MCG tablet Take 175 mcg by mouth daily. 175 mcg daily, except on Sundays and then take 175 mcg +1/2 tablet.      . Multiple Vitamin (MULTIVITAMIN) tablet Take 1 tablet by mouth daily.      Marland Kitchen oxyCODONE-acetaminophen (PERCOCET) 7.5-325 MG per tablet Take 1 tablet by mouth every 4 (four) hours as needed for pain.  20 tablet  0  . polycarbophil (FIBERCON) 625 MG tablet Take 625 mg by mouth daily.      . promethazine (PHENERGAN) 25 MG tablet Take 25 mg by mouth as needed.       . traMADol (ULTRAM) 50 MG tablet Take 1-2 tablets (50-100 mg total) by mouth every 6 (six) hours as needed for pain.  30 tablet  1  . vitamin C (ASCORBIC ACID) 500 MG tablet Take 500 mg by mouth daily.       No current facility-administered medications on file prior to visit.      Review of Systems    see HPI Objective:   Physical Exam  Physical Exam  Nursing note and vitals reviewed.  Constitutional: She is oriented to person, place, and time. She appears well-developed and well-nourished.  HENT:  Head: Normocephalic and atraumatic.  Cardiovascular: Normal rate and regular rhythm.  Exam reveals no gallop and no friction rub.  No murmur heard.  Pulmonary/Chest: Breath sounds normal. She has end expiratory wheezing. She has no rales.  Neurological: She is alert and oriented to person, place, and time.  Skin: Skin is warm and dry.  Psychiatric: She has a normal mood and affect. Her behavior is  normal.        Assessment & Plan:  Asthma exacerbation/ asthmatic bronchitis  Will give Depomedrol 160 mg in office.  Begin Dulera 100/5/ bid and predisone 60 mg taper and Z-pak.  See me in 2 weeks  Thyroid CA with hypothyroidism  I advised pt to continue care with Dr.CAntley and she is agreeable to this  GERD with gastoparesis and chronic Nausea :  I advised pt that I need to review Dr. Alyson Ingles records before making a decision about taking over her GI care.  She will sign for old records  Abnormal cells per pt report S/P hysterectomy  Advised to continue care with Dr. 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

## 2013-12-28 LAB — TSH: TSH: 0.051 u[IU]/mL — AB (ref 0.350–4.500)

## 2013-12-28 LAB — VITAMIN D 25 HYDROXY (VIT D DEFICIENCY, FRACTURES): Vit D, 25-Hydroxy: 53 ng/mL (ref 30–89)

## 2013-12-31 ENCOUNTER — Encounter: Payer: Self-pay | Admitting: *Deleted

## 2013-12-31 DIAGNOSIS — E890 Postprocedural hypothyroidism: Secondary | ICD-10-CM | POA: Insufficient documentation

## 2013-12-31 DIAGNOSIS — Z9889 Other specified postprocedural states: Secondary | ICD-10-CM | POA: Insufficient documentation

## 2013-12-31 DIAGNOSIS — Z9081 Acquired absence of spleen: Secondary | ICD-10-CM | POA: Insufficient documentation

## 2014-01-10 ENCOUNTER — Encounter: Payer: Self-pay | Admitting: Internal Medicine

## 2014-01-10 ENCOUNTER — Ambulatory Visit (HOSPITAL_BASED_OUTPATIENT_CLINIC_OR_DEPARTMENT_OTHER)
Admission: RE | Admit: 2014-01-10 | Discharge: 2014-01-10 | Disposition: A | Discharge status: Home / Self Care | Payer: 59 | Source: Ambulatory Visit | Attending: Internal Medicine | Admitting: Internal Medicine

## 2014-01-10 ENCOUNTER — Ambulatory Visit (INDEPENDENT_AMBULATORY_CARE_PROVIDER_SITE_OTHER): Payer: 59 | Admitting: Internal Medicine

## 2014-01-10 VITALS — BP 129/84 | HR 95 | Temp 98.1°F | Resp 18 | Ht 65.5 in | Wt 143.0 lb

## 2014-01-10 DIAGNOSIS — J45901 Unspecified asthma with (acute) exacerbation: Secondary | ICD-10-CM

## 2014-01-10 DIAGNOSIS — R05 Cough: Secondary | ICD-10-CM | POA: Insufficient documentation

## 2014-01-10 DIAGNOSIS — J9801 Acute bronchospasm: Secondary | ICD-10-CM

## 2014-01-10 DIAGNOSIS — R059 Cough, unspecified: Secondary | ICD-10-CM | POA: Insufficient documentation

## 2014-01-10 MED ORDER — ALBUTEROL SULFATE (2.5 MG/3ML) 0.083% IN NEBU
5.0000 mg | INHALATION_SOLUTION | Freq: Once | RESPIRATORY_TRACT | Status: AC
  Administered 2014-01-10: 5 mg via RESPIRATORY_TRACT

## 2014-01-10 MED ORDER — METHYLPREDNISOLONE ACETATE 80 MG/ML IJ SUSP
160.0000 mg | Freq: Once | INTRAMUSCULAR | Status: AC
  Administered 2014-01-10: 160 mg via INTRAMUSCULAR

## 2014-01-10 MED ORDER — LEVOFLOXACIN 750 MG PO TABS: 750.0000 mg | ORAL_TABLET | Freq: Every day | ORAL | Status: DC

## 2014-01-10 MED ORDER — PREDNISONE 20 MG PO TABS: ORAL_TABLET | ORAL | Status: DC

## 2014-01-10 MED ORDER — HYDROCOD POLST-CHLORPHEN POLST 10-8 MG/5ML PO LQCR: 5.0000 mL | Freq: Two times a day (BID) | ORAL | Status: DC | PRN

## 2014-01-10 NOTE — Progress Notes (Signed)
Subjective:    Patient ID: Sarah Huber, female    DOB: Mar 12, 1956, 58 y.o.   MRN: [id number]  HPI  Sarah Huber is here for follow up.   She is here with her daughter in law who is a nurse     She has noted more productive cough since last visit  White/yellow mucous.    She tells me her chronic GERD is under control  (S/P Nissen fundoplication)  But she has lots of scarring in her neck post thyroidectomy, vocal cord surgery and treatment for her thyroid cancer.    She cannot expectorate well due to scarring in her neck.    Using rescue inhaler 3-4 times per day   Allergies  Allergen Reactions  . Adhesive [Tape] Other (See Comments)    BLISTERS  . Hydrocodone Other (See Comments)    SEVERE HEADACHE  . Morphine And Related Other (See Comments)    SEVERE HEADACHE  . Aspirin Palpitations  . Penicillins Palpitations   Past Medical History  Diagnosis Date  . Headache(784.0)     migraines  . Seasonal allergies   . Personal history of asthma     "allergic asthma"  . History of hiatal hernia     repaired 2011  . GERD (gastroesophageal reflux disease)     occasionally; no current med.  . Hypothyroidism     s/p thyroidectomy  . Chronic abdominal pain     left side - states is scar tissue from previous surgeries  . Difficulty swallowing     food and pills  . 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 Complication of anesthesia     small airway; anesthesia awareness; states remembers things that occur during surgery  . Mass of axilla 11/2012    left  . Cancer     thyroid   Past Surgical History  Procedure Laterality Date  . Appendectomy    . Thyroidectomy    . Splenectomy, total    . Lipoma excision Left 10/19/2001    groin  . Esophagogastric fundoplasty  12/09/2009    laparoscopic takedown and mobilization of foregut  . Hiatal hernia repair  12/09/2009  . Nissen fundoplication      x 3  . Laparoscopic lysis of adhesions  01/27/2011    resection of  scar tissue; wound exploration  . Total abdominal hysterectomy w/ bilateral salpingoophorectomy  07/30/2010  . Other surgical history  2004 - 2010    vocal cord surgery x 6  . Mass excision Left 12/20/2012    Procedure: EXCISION MASS LEFT AXILLA;  Surgeon: Merrie Roof, MD;  Location: [place];  Service: General;  Laterality: Left;   History   Social History  . Marital Status: Married    Spouse Name: N/A    Number of Children: N/A  . Years of Education: N/A   Occupational History  . Not on file.   Social History Main Topics  . Smoking status: Current Some Day Smoker  . Smokeless tobacco: Never Used     Comment: quit smoking 2010  . Alcohol Use: 0.0 oz/week     Comment: occasionally  . Drug Use: No  . Sexual Activity: Yes   Other Topics Concern  . Not on file   Social History Narrative  . No narrative on file   Family History  Problem Relation Age of Onset  . Thyroid cancer Sister   . Colon cancer Maternal Grandfather   .  Cancer Maternal Grandfather 42    colon   . Anesthesia problems Mother     "hard time coming out"  . Hypertension Mother   . Hyperlipidemia Mother   . Alcohol abuse Father    Patient Active Problem List   Diagnosis Date Noted  . 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 S/P thyroidectomy 12/31/2013  . Asthma 12/27/2013  . Fibrocystic breast disease 12/27/2013  .  S/P total hysteretomy and BSO  precancerous cervical changes per pt 12/27/2013  .   Thyroid cancer  Dr. Tod Persia 12/27/2013  . GERD (gastroesophageal reflux disease) 12/27/2013  . Osteopenia 12/27/2013  . Hypothyroidism 12/27/2013  . Status post Nissen fundoplication 02/16/9484  . Breast density 12/27/2013  . Seroma complicating a procedure 12/27/2012  . Lipoma of axilla left 11/17/2012   Current Outpatient Prescriptions on File Prior to Visit  Medication Sig Dispense Refill  . albuterol (PROVENTIL HFA;VENTOLIN HFA) 108 (90 BASE) MCG/ACT inhaler 2 inhaltions tid prn  wheezing or SOB  1 Inhaler  0  . amitriptyline (ELAVIL) 50 MG tablet Take 50 mg by mouth at bedtime.      . butalbital-acetaminophen-caffeine (FIORICET, ESGIC) 50-325-40 MG per tablet Take 1 tablet by mouth 2 (two) times daily as needed for headache.      . calcium carbonate (OS-CAL) 600 MG TABS Take 600 mg by mouth 2 (two) times daily with a meal.      . cholecalciferol (VITAMIN D) 1000 UNITS tablet Take 1,000 Units by mouth daily.      Marland Kitchen conjugated estrogens (PREMARIN) vaginal cream Place vaginally daily. 3 x/week      . fish oil-omega-3 fatty acids 1000 MG capsule Take 2 g by mouth daily.      Marland Kitchen levothyroxine (SYNTHROID, LEVOTHROID) 175 MCG tablet Take 175 mcg by mouth daily. 175 mcg daily, except on Sundays and then take 175 mcg +1/2 tablet.      . mometasone-formoterol (DULERA) 100-5 MCG/ACT AERO Inhale 2 puffs into the lungs 2 (two) times daily.      . Multiple Vitamin (MULTIVITAMIN) tablet Take 1 tablet by mouth daily.      . polycarbophil (FIBERCON) 625 MG tablet Take 625 mg by mouth daily.      . promethazine (PHENERGAN) 25 MG tablet Take 25 mg by mouth as needed.       . traMADol (ULTRAM) 50 MG tablet Take 1-2 tablets (50-100 mg total) by mouth every 6 (six) hours as needed for pain.  30 tablet  1  . triamterene-hydrochlorothiazide (MAXZIDE) 75-50 MG per tablet Take 1 tablet by mouth daily.      . vitamin C (ASCORBIC ACID) 500 MG tablet Take 500 mg by mouth daily.      Marland Kitchen oxyCODONE-acetaminophen (PERCOCET) 7.5-325 MG per tablet Take 1 tablet by mouth every 4 (four) hours as needed for pain.  20 tablet  0   No current facility-administered medications on file prior to visit.     Review of Systems See HPI     Objective:   Physical Exam  Peak flow 270  Pulse ox 98%  Physical Exam  Nursing note and vitals reviewed.  Constitutional: She is oriented to person, place, and time. She appears well-developed and well-nourished.  HENT:  Head: Normocephalic and atraumatic.    Cardiovascular: Normal rate and regular rhythm. Exam reveals no gallop and no friction rub.  No murmur heard.  Pulmonary/Chest: Breath sounds normal. She has bilateral end expiratory wheezing  Lots of coughing but unable to fully expectorate orally  Neurological: She is alert and oriented to person, place, and time.  Skin: Skin is warm and dry.  Psychiatric: She has a normal mood and affect. Her behavior is normal.         Assessment & Plan:  Bronchospasm/ asthma:   Due to scar tissue in neck ,  There may be chronic aspiration when she coughs.  She cannot expectorate orally very well at all   CXR today  No pneumonia.   HHN with albuterol x 2 here in office    Depomedrol  160 mg Im in office.  Will continue prednisone  40 mg taper q 3 days  Dulera 2 inhalations bid.  I counseled pt and her daughter in law that if worsening cough, SOB, or using albuterol more than 3 times in 24 hour period to go to ER .  I feel she is safe to go home today but if any worsening or inablility to sleep due to SOB or wheezing she is to go to ER.   ADvised to stay in A/C this weekend and not be outside much.   She voices understanding.    Cough for Tussionex at night.   I spent 45 mins with this pt  See me early next week.

## 2014-01-10 NOTE — Patient Instructions (Signed)
Go to xray and return to office after done  Take prednisone as ordered  Use Dulera twice a day as ordered  If using your albuterol (Ventolin, Proventil )  More that 3 times in a 24 hour period,  Or worsening shortness of breath  Go to ER   See me Tuesday  Or Wednesday in office

## 2014-01-11 ENCOUNTER — Telehealth: Payer: Self-pay | Admitting: *Deleted

## 2014-01-11 NOTE — Telephone Encounter (Signed)
Pt states that her sx of SOB are about the same, She is using the medications as prescribed, Advised pt to go to ED if sx persists or worsen pt verbalized understanding

## 2014-01-15 ENCOUNTER — Ambulatory Visit: Payer: BC Managed Care – PPO | Admitting: Internal Medicine

## 2014-01-15 ENCOUNTER — Encounter (HOSPITAL_BASED_OUTPATIENT_CLINIC_OR_DEPARTMENT_OTHER): Payer: Self-pay | Admitting: Emergency Medicine

## 2014-01-15 ENCOUNTER — Emergency Department (HOSPITAL_BASED_OUTPATIENT_CLINIC_OR_DEPARTMENT_OTHER): Payer: 59

## 2014-01-15 ENCOUNTER — Emergency Department (HOSPITAL_BASED_OUTPATIENT_CLINIC_OR_DEPARTMENT_OTHER)
Admission: EM | Admit: 2014-01-15 | Discharge: 2014-01-15 | Disposition: A | Discharge status: Home / Self Care | Payer: 59 | Attending: Emergency Medicine | Admitting: Emergency Medicine

## 2014-01-15 DIAGNOSIS — F17200 Nicotine dependence, unspecified, uncomplicated: Secondary | ICD-10-CM | POA: Insufficient documentation

## 2014-01-15 DIAGNOSIS — E039 Hypothyroidism, unspecified: Secondary | ICD-10-CM | POA: Insufficient documentation

## 2014-01-15 DIAGNOSIS — Z880 Allergy status to penicillin: Secondary | ICD-10-CM | POA: Insufficient documentation

## 2014-01-15 DIAGNOSIS — Z792 Long term (current) use of antibiotics: Secondary | ICD-10-CM | POA: Insufficient documentation

## 2014-01-15 DIAGNOSIS — Z85850 Personal history of malignant neoplasm of thyroid: Secondary | ICD-10-CM | POA: Insufficient documentation

## 2014-01-15 DIAGNOSIS — J45901 Unspecified asthma with (acute) exacerbation: Secondary | ICD-10-CM | POA: Insufficient documentation

## 2014-01-15 DIAGNOSIS — J40 Bronchitis, not specified as acute or chronic: Secondary | ICD-10-CM

## 2014-01-15 DIAGNOSIS — Z79899 Other long term (current) drug therapy: Secondary | ICD-10-CM | POA: Insufficient documentation

## 2014-01-15 DIAGNOSIS — K219 Gastro-esophageal reflux disease without esophagitis: Secondary | ICD-10-CM | POA: Insufficient documentation

## 2014-01-15 DIAGNOSIS — IMO0002 Reserved for concepts with insufficient information to code with codable children: Secondary | ICD-10-CM | POA: Insufficient documentation

## 2014-01-15 DIAGNOSIS — Z8679 Personal history of other diseases of the circulatory system: Secondary | ICD-10-CM | POA: Insufficient documentation

## 2014-01-15 DIAGNOSIS — G8929 Other chronic pain: Secondary | ICD-10-CM | POA: Insufficient documentation

## 2014-01-15 LAB — CBC WITH DIFFERENTIAL/PLATELET
BASOS ABS: 0 10*3/uL (ref 0.0–0.1)
BASOS PCT: 0 % (ref 0–1)
Eosinophils Absolute: 0 10*3/uL (ref 0.0–0.7)
Eosinophils Relative: 0 % (ref 0–5)
HCT: 38 % (ref 36.0–46.0)
Hemoglobin: 13 g/dL (ref 12.0–15.0)
Lymphocytes Relative: 24 % (ref 12–46)
Lymphs Abs: 2.7 10*3/uL (ref 0.7–4.0)
MCH: 33.2 pg (ref 26.0–34.0)
MCHC: 34.2 g/dL (ref 30.0–36.0)
MCV: 96.9 fL (ref 78.0–100.0)
Monocytes Absolute: 1.2 10*3/uL — ABNORMAL HIGH (ref 0.1–1.0)
Monocytes Relative: 11 % (ref 3–12)
NEUTROS ABS: 7.1 10*3/uL (ref 1.7–7.7)
NEUTROS PCT: 65 % (ref 43–77)
Platelets: 321 10*3/uL (ref 150–400)
RBC: 3.92 MIL/uL (ref 3.87–5.11)
RDW: 13.5 % (ref 11.5–15.5)
WBC: 11 10*3/uL — ABNORMAL HIGH (ref 4.0–10.5)

## 2014-01-15 LAB — BASIC METABOLIC PANEL
BUN: 26 mg/dL — ABNORMAL HIGH (ref 6–23)
CHLORIDE: 103 meq/L (ref 96–112)
CO2: 27 mEq/L (ref 19–32)
Calcium: 9.2 mg/dL (ref 8.4–10.5)
Creatinine, Ser: 0.8 mg/dL (ref 0.50–1.10)
GFR calc non Af Amer: 80 mL/min — ABNORMAL LOW (ref >90)
Glucose, Bld: 119 mg/dL — ABNORMAL HIGH (ref 70–99)
POTASSIUM: 4.2 meq/L (ref 3.7–5.3)
SODIUM: 142 meq/L (ref 137–147)

## 2014-01-15 MED ORDER — IPRATROPIUM-ALBUTEROL 0.5-2.5 (3) MG/3ML IN SOLN: 3.0000 mL | RESPIRATORY_TRACT | Status: DC

## 2014-01-15 MED ORDER — IPRATROPIUM-ALBUTEROL 0.5-2.5 (3) MG/3ML IN SOLN: 3.0000 mL | Freq: Once | RESPIRATORY_TRACT | Status: DC

## 2014-01-15 MED ORDER — IPRATROPIUM-ALBUTEROL 0.5-2.5 (3) MG/3ML IN SOLN
RESPIRATORY_TRACT | Status: DC
Start: 2014-01-15 — End: 2014-01-15
  Filled 2014-01-15: qty 3

## 2014-01-15 MED ORDER — METHYLPREDNISOLONE SODIUM SUCC 125 MG IJ SOLR
125.0000 mg | Freq: Once | INTRAMUSCULAR | Status: AC
  Administered 2014-01-15: 125 mg via INTRAVENOUS
  Filled 2014-01-15: qty 2

## 2014-01-15 NOTE — ED Provider Notes (Signed)
CSN: [id number]     Arrival date & time 01/15/14  1309 History   First MD Initiated Contact with Patient 01/15/14 1404     Chief Complaint  Patient presents with  . Shortness of Breath  . Cough     (Consider location/radiation/quality/duration/timing/severity/associated sxs/prior Treatment) Patient is a 58 y.o. female presenting with shortness of breath and cough. The history is provided by the patient. No language interpreter was used.  Shortness of Breath Severity:  Moderate Onset quality:  Gradual Timing:  Constant Chronicity:  New Context: known allergens   Relieved by:  Nothing Worsened by:  Nothing tried Ineffective treatments:  None tried Associated symptoms: cough   Risk factors: hx of cancer   Cough Associated symptoms: shortness of breath   Pt complains of continued cough.   Pt reports no relief with steroids or neb treatments.  Pt has appointment to see her MD tomorrow  Past Medical History  Diagnosis Date  . Headache(784.0)     migraines  . Seasonal allergies   . Personal history of asthma     "allergic asthma"  . History of hiatal hernia     repaired 2011  . GERD (gastroesophageal reflux disease)     occasionally; no current med.  . Hypothyroidism     s/p thyroidectomy  . Chronic abdominal pain     left side - states is scar tissue from previous surgeries  . Difficulty swallowing     food and pills  . 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 Complication of anesthesia     small airway; anesthesia awareness; states remembers things that occur during surgery  . Mass of axilla 11/2012    left  . Cancer     thyroid   Past Surgical History  Procedure Laterality Date  . Appendectomy    . Thyroidectomy    . Splenectomy, total    . Lipoma excision Left 10/19/2001    groin  . Esophagogastric fundoplasty  12/09/2009    laparoscopic takedown and mobilization of foregut  . Hiatal hernia repair  12/09/2009  . Nissen  fundoplication      x 3  . Laparoscopic lysis of adhesions  01/27/2011    resection of scar tissue; wound exploration  . Total abdominal hysterectomy w/ bilateral salpingoophorectomy  07/30/2010  . Other surgical history  2004 - 2010    vocal cord surgery x 6  . Mass excision Left 12/20/2012    Procedure: EXCISION MASS LEFT AXILLA;  Surgeon: Merrie Roof, MD;  Location: [place];  Service: General;  Laterality: Left;   Family History  Problem Relation Age of Onset  . Thyroid cancer Sister   . Colon cancer Maternal Grandfather   . Cancer Maternal Grandfather 54    colon   . Anesthesia problems Mother     "hard time coming out"  . Hypertension Mother   . Hyperlipidemia Mother   . Alcohol abuse Father    History  Substance Use Topics  . Smoking status: Current Some Day Smoker  . Smokeless tobacco: Never Used     Comment: quit smoking 2010  . Alcohol Use: 0.0 oz/week     Comment: occasionally   OB History   Grav Para Term Preterm Abortions TAB SAB Ect Mult Living   1         1     Review of Systems  Respiratory: Positive for cough and shortness of  breath.   All other systems reviewed and are negative.     Allergies  Adhesive; Hydrocodone; Morphine and related; Aspirin; and Penicillins  Home Medications   Prior to Admission medications   Medication Sig Start Date End Date Taking? Authorizing Provider  albuterol (PROVENTIL HFA;VENTOLIN HFA) 108 (90 BASE) MCG/ACT inhaler 2 inhaltions tid prn wheezing or SOB 12/27/13   Lanice Shirts, MD  amitriptyline (ELAVIL) 50 MG tablet Take 50 mg by mouth at bedtime.    Historical Provider, MD  butalbital-acetaminophen-caffeine (FIORICET, ESGIC) 50-325-40 MG per tablet Take 1 tablet by mouth 2 (two) times daily as needed for headache.    Historical Provider, MD  calcium carbonate (OS-CAL) 600 MG TABS Take 600 mg by mouth 2 (two) times daily with a meal.    Historical Provider, MD  chlorpheniramine-HYDROcodone  (TUSSIONEX PENNKINETIC ER) 10-8 MG/5ML LQCR Take 5 mLs by mouth every 12 (twelve) hours as needed for cough. 01/10/14   Lanice Shirts, MD  cholecalciferol (VITAMIN D) 1000 UNITS tablet Take 1,000 Units by mouth daily.    Historical Provider, MD  conjugated estrogens (PREMARIN) vaginal cream Place vaginally daily. 3 x/week    Historical Provider, MD  fish oil-omega-3 fatty acids 1000 MG capsule Take 2 g by mouth daily.    Historical Provider, MD  levofloxacin (LEVAQUIN) 750 MG tablet Take 1 tablet (750 mg total) by mouth daily. 01/10/14   Lanice Shirts, MD  levothyroxine (SYNTHROID, LEVOTHROID) 175 MCG tablet Take 175 mcg by mouth daily. 175 mcg daily, except on Sundays and then take 175 mcg +1/2 tablet.    Historical Provider, MD  mometasone-formoterol (DULERA) 100-5 MCG/ACT AERO Inhale 2 puffs into the lungs 2 (two) times daily.    Historical Provider, MD  Multiple Vitamin (MULTIVITAMIN) tablet Take 1 tablet by mouth daily.    Historical Provider, MD  polycarbophil (FIBERCON) 625 MG tablet Take 625 mg by mouth daily.    Historical Provider, MD  predniSONE (DELTASONE) 20 MG tablet Take 2 tablets together daily for 4 days then 1 tablet in am daily for 4 days then stop 01/10/14   Lanice Shirts, MD  promethazine (PHENERGAN) 25 MG tablet Take 25 mg by mouth as needed.     Historical Provider, MD  traMADol (ULTRAM) 50 MG tablet Take 1-2 tablets (50-100 mg total) by mouth every 6 (six) hours as needed for pain. 01/10/13   Pedro Earls, MD  triamterene-hydrochlorothiazide (MAXZIDE) 75-50 MG per tablet Take 1 tablet by mouth daily.    Historical Provider, MD  vitamin C (ASCORBIC ACID) 500 MG tablet Take 500 mg by mouth daily.    Historical Provider, MD   BP 113/71  Pulse 80  Temp(Src) 98.7 F (37.1 C) (Oral)  Ht 5\' 5"  (1.651 m)  Wt 140 lb (63.504 kg)  BMI 23.30 kg/m2  SpO2 98% Physical Exam  Nursing note and vitals reviewed. Constitutional: She is oriented to person, place, and  time. She appears well-developed and well-nourished.  HENT:  Head: Normocephalic and atraumatic.  Right Ear: External ear normal.  Eyes: Conjunctivae and EOM are normal. Pupils are equal, round, and reactive to light.  Neck: Normal range of motion.  Cardiovascular: Normal rate and normal heart sounds.   Pulmonary/Chest: Effort normal and breath sounds normal.  Abdominal: She exhibits no distension.  Musculoskeletal: Normal range of motion.  Neurological: She is alert and oriented to person, place, and time.  Skin: Skin is warm.  Psychiatric: She has a normal mood and affect.  ED Course  Procedures (including critical care time) Labs Review Labs Reviewed  CBC WITH DIFFERENTIAL - Abnormal; Notable for the following:    WBC 11.0 (*)    Monocytes Absolute 1.2 (*)    All other components within normal limits  BASIC METABOLIC PANEL - Abnormal; Notable for the following:    Glucose, Bld 119 (*)    BUN 26 (*)    GFR calc non Af Amer 80 (*)    All other components within normal limits    Imaging Review Dg Chest 2 View  01/15/2014   CLINICAL DATA:  Cough, shortness of breath  EXAM: CHEST  2 VIEW  COMPARISON:  01/10/2014  FINDINGS: Cardiomediastinal silhouette is stable. No acute infiltrate or pulmonary edema. Central mild bronchitic changes. Stable degenerative changes thoracic spine.  IMPRESSION: No acute infiltrate or pulmonary edema. Central mild bronchitic changes.   Electronically Signed   By: Lahoma Crocker M.D.   On: 01/15/2014 15:20     EKG Interpretation None      MDM   Final diagnoses:  Bronchitis    Pt given solumedrol IV.   Pt advised to see Dr. Bertell Maria tomorrow as scheduled    Fransico Meadow, PA-C 01/15/14 1603

## 2014-01-15 NOTE — Discharge Instructions (Signed)

## 2014-01-15 NOTE — ED Notes (Signed)
Resp. Therapist has been with Pt.

## 2014-01-15 NOTE — ED Provider Notes (Signed)
Medical screening examination/treatment/procedure(s) were performed by non-physician practitioner and as supervising physician I was immediately available for consultation/collaboration.   EKG Interpretation None        Ezequiel Essex, MD 01/15/14 [phone]

## 2014-01-15 NOTE — ED Notes (Signed)
SHOB, cough and wheezing intermittently x 3-4 week.  Has been seen by PMD and on steroids and asthma medications without relief.

## 2014-01-16 ENCOUNTER — Ambulatory Visit (INDEPENDENT_AMBULATORY_CARE_PROVIDER_SITE_OTHER): Payer: 59 | Admitting: Internal Medicine

## 2014-01-16 ENCOUNTER — Ambulatory Visit: Payer: 59 | Admitting: Internal Medicine

## 2014-01-16 ENCOUNTER — Encounter: Payer: Self-pay | Admitting: Internal Medicine

## 2014-01-16 VITALS — BP 117/68 | HR 91 | Temp 97.9°F | Resp 18 | Wt 149.0 lb

## 2014-01-16 DIAGNOSIS — J45901 Unspecified asthma with (acute) exacerbation: Secondary | ICD-10-CM

## 2014-01-16 DIAGNOSIS — R059 Cough, unspecified: Secondary | ICD-10-CM

## 2014-01-16 DIAGNOSIS — J9801 Acute bronchospasm: Secondary | ICD-10-CM

## 2014-01-16 DIAGNOSIS — R05 Cough: Secondary | ICD-10-CM

## 2014-01-16 DIAGNOSIS — J209 Acute bronchitis, unspecified: Secondary | ICD-10-CM

## 2014-01-16 MED ORDER — METHYLPREDNISOLONE SODIUM SUCC 125 MG IJ SOLR
125.0000 mg | Freq: Once | INTRAMUSCULAR | Status: AC
Start: 2014-01-16 — End: 2014-01-16
  Administered 2014-01-16: 125 mg via INTRAMUSCULAR

## 2014-01-16 MED ORDER — HYDROCOD POLST-CHLORPHEN POLST 10-8 MG/5ML PO LQCR: 5.0000 mL | Freq: Two times a day (BID) | ORAL | Status: DC | PRN

## 2014-01-16 MED ORDER — PREDNISONE 10 MG PO TABS: ORAL_TABLET | ORAL | Status: DC

## 2014-01-16 NOTE — Patient Instructions (Addendum)
See me in one week  30 mins  Amb referral to pulmonology  Dr. Elsworth Soho

## 2014-01-16 NOTE — Progress Notes (Signed)
Subjective:    Patient ID: Sarah Huber, female    DOB: [birth year], 58 y.o.   MRN: [id number]  HPI  Sarah Huber is here for follow up . She was seen in ER yesterday  Still coughing quite a bit  See note.  Was given Solumedrol .     Able to sleep through the night last night without rescue inhaler. Has not used it this am.  Pulse ox 99%  Peak flow 300 today in office  Allergies  Allergen Reactions  . Adhesive [Tape] Other (See Comments)    BLISTERS  . Hydrocodone Other (See Comments)    SEVERE HEADACHE  . Morphine And Related Other (See Comments)    SEVERE HEADACHE  . Aspirin Palpitations  . Penicillins Palpitations   Past Medical History  Diagnosis Date  . Headache(784.0)     migraines  . Seasonal allergies   . Personal history of asthma     "allergic asthma"  . History of hiatal hernia     repaired 2011  . GERD (gastroesophageal reflux disease)     occasionally; no current med.  . Hypothyroidism     s/p thyroidectomy  . Chronic abdominal pain     left side - states is scar tissue from previous surgeries  . Difficulty swallowing     food and pills  . 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 Complication of anesthesia     small airway; anesthesia awareness; states remembers things that occur during surgery  . Mass of axilla 11/2012    left  . Cancer     thyroid   Past Surgical History  Procedure Laterality Date  . Appendectomy    . Thyroidectomy    . Splenectomy, total    . Lipoma excision Left 10/19/2001    groin  . Esophagogastric fundoplasty  12/09/2009    laparoscopic takedown and mobilization of foregut  . Hiatal hernia repair  12/09/2009  . Nissen fundoplication      x 3  . Laparoscopic lysis of adhesions  01/27/2011    resection of scar tissue; wound exploration  . Total abdominal hysterectomy w/ bilateral salpingoophorectomy  07/30/2010  . Other surgical history  2004 - 2010    vocal cord surgery x 6  . Mass excision Left  12/20/2012    Procedure: EXCISION MASS LEFT AXILLA;  Surgeon: Merrie Roof, MD;  Location: [facility];  Service: General;  Laterality: Left;   History   Social History  . Marital Status: Married    Spouse Name: N/A    Number of Children: N/A  . Years of Education: N/A   Occupational History  . Not on file.   Social History Main Topics  . Smoking status: Current Some Day Smoker  . Smokeless tobacco: Never Used     Comment: quit smoking 2010  . Alcohol Use: 0.0 oz/week     Comment: occasionally  . Drug Use: No  . Sexual Activity: Yes   Other Topics Concern  . Not on file   Social History Narrative  . No narrative on file   Family History  Problem Relation Age of Onset  . Thyroid cancer Sister   . Colon cancer Maternal Grandfather   . Cancer Maternal Grandfather 42    colon   . Anesthesia problems Mother     "hard time coming out"  . Hypertension Mother   . Hyperlipidemia Mother   . Alcohol  abuse Father    Patient Active Problem List   Diagnosis Date Noted  . 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 S/P thyroidectomy 12/31/2013  . Asthma 12/27/2013  . Fibrocystic breast disease 12/27/2013  .  S/P total hysteretomy and BSO  precancerous cervical changes per pt 12/27/2013  .   Thyroid cancer  Dr. Tod Persia 12/27/2013  . GERD (gastroesophageal reflux disease) 12/27/2013  . Osteopenia 12/27/2013  . Hypothyroidism 12/27/2013  . Status post Nissen fundoplication 44/31/5400  . Breast density 12/27/2013  . Seroma complicating a procedure 12/27/2012  . Lipoma of axilla left 11/17/2012   Current Outpatient Prescriptions on File Prior to Visit  Medication Sig Dispense Refill  . albuterol (PROVENTIL HFA;VENTOLIN HFA) 108 (90 BASE) MCG/ACT inhaler 2 inhaltions tid prn wheezing or SOB  1 Inhaler  0  . amitriptyline (ELAVIL) 50 MG tablet Take 50 mg by mouth at bedtime.      . butalbital-acetaminophen-caffeine (FIORICET, ESGIC) 50-325-40 MG per tablet Take 1 tablet by  mouth 2 (two) times daily as needed for headache.      . calcium carbonate (OS-CAL) 600 MG TABS Take 600 mg by mouth 2 (two) times daily with a meal.      . cholecalciferol (VITAMIN D) 1000 UNITS tablet Take 1,000 Units by mouth daily.      Marland Kitchen conjugated estrogens (PREMARIN) vaginal cream Place vaginally daily. 3 x/week      . fish oil-omega-3 fatty acids 1000 MG capsule Take 2 g by mouth daily.      Marland Kitchen levofloxacin (LEVAQUIN) 750 MG tablet Take 1 tablet (750 mg total) by mouth daily.  10 tablet  0  . levothyroxine (SYNTHROID, LEVOTHROID) 175 MCG tablet Take 175 mcg by mouth daily. 175 mcg daily, except on Sundays and then take 175 mcg +1/2 tablet.      . mometasone-formoterol (DULERA) 100-5 MCG/ACT AERO Inhale 2 puffs into the lungs 2 (two) times daily.      . Multiple Vitamin (MULTIVITAMIN) tablet Take 1 tablet by mouth daily.      . polycarbophil (FIBERCON) 625 MG tablet Take 625 mg by mouth daily.      . promethazine (PHENERGAN) 25 MG tablet Take 25 mg by mouth as needed.       . traMADol (ULTRAM) 50 MG tablet Take 1-2 tablets (50-100 mg total) by mouth every 6 (six) hours as needed for pain.  30 tablet  1  . triamterene-hydrochlorothiazide (MAXZIDE) 75-50 MG per tablet Take 1 tablet by mouth daily.      . vitamin C (ASCORBIC ACID) 500 MG tablet Take 500 mg by mouth daily.       No current facility-administered medications on file prior to visit.       Review of Systems    see HPI Objective:   Physical Exam  Physical Exam  Nursing note and vitals reviewed. Peak flow 300  Pulse ox 99% Constitutional: She is oriented to person, place, and time. She appears well-developed and well-nourished.  HENT:  Head: Normocephalic and atraumatic.  Cardiovascular: Normal rate and regular rhythm. Exam reveals no gallop and no friction rub.  No murmur heard.  Pulmonary/Chest: Breath sounds normal. She has no wheezes. She has no rales.   Good bilateral air flow today Neurological: She is alert and  oriented to person, place, and time.  Skin: Skin is warm and dry.  Psychiatric: She has a normal mood and affect. Her behavior is normal.         Assessment & Plan:  Asthmatic bronchitis:  Sarah Huber has a remote history of asthma but has not had a flare in many years per her report.  Will start assessment with formal PFT"S    She is to stay on Dulera 100/5 2 inhaltions bid.  Slow prednisone taper.  Will give 10 mg over next 7 days .  Course is complicated by  GERD and  lots of scar tissue in her neck with mulitple surgeries including vocal chord surgery and thyroidectomy for her thyoid CA.   Solumedrol 125 mg IM in office today    See me next week -  I do think she has turned the corner   Will refer to Dr. Elsworth Soho for formal PFT's and assessment  Bronchospasm  See above  Cough Ok for Tussionex at night    GERD  S/P Nissen fundoplication  Thyroid CA

## 2014-01-21 NOTE — Progress Notes (Signed)
Pl make appt at [facility]

## 2014-01-22 ENCOUNTER — Encounter: Payer: Self-pay | Admitting: Internal Medicine

## 2014-01-22 ENCOUNTER — Ambulatory Visit (INDEPENDENT_AMBULATORY_CARE_PROVIDER_SITE_OTHER): Payer: 59 | Admitting: Internal Medicine

## 2014-01-22 VITALS — BP 116/73 | HR 84 | Temp 98.3°F | Resp 18 | Wt 147.0 lb

## 2014-01-22 DIAGNOSIS — R05 Cough: Secondary | ICD-10-CM

## 2014-01-22 DIAGNOSIS — J45901 Unspecified asthma with (acute) exacerbation: Secondary | ICD-10-CM

## 2014-01-22 DIAGNOSIS — R059 Cough, unspecified: Secondary | ICD-10-CM

## 2014-01-22 DIAGNOSIS — J9801 Acute bronchospasm: Secondary | ICD-10-CM

## 2014-01-22 NOTE — Progress Notes (Signed)
Subjective:    Patient ID: Sarah Huber, female    DOB: 05-23-1956, 58 y.o.   MRN: [id number]  HPI Sarah Huber is here for follow up.   She is now on Prednisone 10 mg for 3 more days.  She has not needed her rescue inhaler since last visit.  Coughing much less  Allergies  Allergen Reactions  . Adhesive [Tape] Other (See Comments)    BLISTERS  . Hydrocodone Other (See Comments)    SEVERE HEADACHE  . Morphine And Related Other (See Comments)    SEVERE HEADACHE  . Aspirin Palpitations  . Penicillins Palpitations   Past Medical History  Diagnosis Date  . Headache(784.0)     migraines  . Seasonal allergies   . Personal history of asthma     "allergic asthma"  . History of hiatal hernia     repaired 2011  . GERD (gastroesophageal reflux disease)     occasionally; no current med.  . Hypothyroidism     s/p thyroidectomy  . Chronic abdominal pain     left side - states is scar tissue from previous surgeries  . Difficulty swallowing     food and pills  . 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 Complication of anesthesia     small airway; anesthesia awareness; states remembers things that occur during surgery  . Mass of axilla 11/2012    left  . Cancer     thyroid   Past Surgical History  Procedure Laterality Date  . Appendectomy    . Thyroidectomy    . Splenectomy, total    . Lipoma excision Left 10/19/2001    groin  . Esophagogastric fundoplasty  12/09/2009    laparoscopic takedown and mobilization of foregut  . Hiatal hernia repair  12/09/2009  . Nissen fundoplication      x 3  . Laparoscopic lysis of adhesions  01/27/2011    resection of scar tissue; wound exploration  . Total abdominal hysterectomy w/ bilateral salpingoophorectomy  07/30/2010  . Other surgical history  2004 - 2010    vocal cord surgery x 6  . Mass excision Left 12/20/2012    Procedure: EXCISION MASS LEFT AXILLA;  Surgeon: Merrie Roof, MD;  Location: [facility];  Service: General;  Laterality: Left;   History   Social History  . Marital Status: Married    Spouse Name: N/A    Number of Children: N/A  . Years of Education: N/A   Occupational History  . Not on file.   Social History Main Topics  . Smoking status: Current Some Day Smoker  . Smokeless tobacco: Never Used     Comment: quit smoking 2010  . Alcohol Use: 0.0 oz/week     Comment: occasionally  . Drug Use: No  . Sexual Activity: Yes   Other Topics Concern  . Not on file   Social History Narrative  . No narrative on file   Family History  Problem Relation Age of Onset  . Thyroid cancer Sister   . Colon cancer Maternal Grandfather   . Cancer Maternal Grandfather 49    colon   . Anesthesia problems Mother     "hard time coming out"  . Hypertension Mother   . Hyperlipidemia Mother   . Alcohol abuse Father    Patient Active Problem List   Diagnosis Date Noted  . 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 S/P thyroidectomy 12/31/2013  .  Asthma 12/27/2013  . Fibrocystic breast disease 12/27/2013  .  S/P total hysteretomy and BSO  precancerous cervical changes per pt 12/27/2013  .   Thyroid cancer  Dr. Tod Persia 12/27/2013  . GERD (gastroesophageal reflux disease) 12/27/2013  . Osteopenia 12/27/2013  . Hypothyroidism 12/27/2013  . Status post Nissen fundoplication 86/76/1950  . Breast density 12/27/2013  . Seroma complicating a procedure 12/27/2012  . Lipoma of axilla left 11/17/2012   Current Outpatient Prescriptions on File Prior to Visit  Medication Sig Dispense Refill  . albuterol (PROVENTIL HFA;VENTOLIN HFA) 108 (90 BASE) MCG/ACT inhaler 2 inhaltions tid prn wheezing or SOB  1 Inhaler  0  . amitriptyline (ELAVIL) 50 MG tablet Take 50 mg by mouth at bedtime.      . butalbital-acetaminophen-caffeine (FIORICET, ESGIC) 50-325-40 MG per tablet Take 1 tablet by mouth 2 (two) times daily as needed for headache.      . calcium carbonate (OS-CAL) 600 MG TABS Take 600 mg by  mouth 2 (two) times daily with a meal.      . chlorpheniramine-HYDROcodone (TUSSIONEX PENNKINETIC ER) 10-8 MG/5ML LQCR Take 5 mLs by mouth every 12 (twelve) hours as needed for cough.  240 mL  0  . cholecalciferol (VITAMIN D) 1000 UNITS tablet Take 1,000 Units by mouth daily.      Marland Kitchen conjugated estrogens (PREMARIN) vaginal cream Place vaginally daily. 3 x/week      . fish oil-omega-3 fatty acids 1000 MG capsule Take 2 g by mouth daily.      Marland Kitchen levofloxacin (LEVAQUIN) 750 MG tablet Take 1 tablet (750 mg total) by mouth daily.  10 tablet  0  . levothyroxine (SYNTHROID, LEVOTHROID) 175 MCG tablet Take 175 mcg by mouth daily. 175 mcg daily, except on Sundays and then take 175 mcg +1/2 tablet.      . mometasone-formoterol (DULERA) 100-5 MCG/ACT AERO Inhale 2 puffs into the lungs 2 (two) times daily.      . Multiple Vitamin (MULTIVITAMIN) tablet Take 1 tablet by mouth daily.      . polycarbophil (FIBERCON) 625 MG tablet Take 625 mg by mouth daily.      . predniSONE (DELTASONE) 10 MG tablet Take one tablet daily for 7 days then stop  7 tablet  0  . promethazine (PHENERGAN) 25 MG tablet Take 25 mg by mouth as needed.       . traMADol (ULTRAM) 50 MG tablet Take 1-2 tablets (50-100 mg total) by mouth every 6 (six) hours as needed for pain.  30 tablet  1  . triamterene-hydrochlorothiazide (MAXZIDE) 75-50 MG per tablet Take 1 tablet by mouth daily.      . vitamin C (ASCORBIC ACID) 500 MG tablet Take 500 mg by mouth daily.       No current facility-administered medications on file prior to visit.       Review of Systems See HPI    Objective:   Physical Exam Physical Exam  Nursing note and vitals reviewed.   Peak flow 300 Constitutional: She is oriented to person, place, and time. She appears well-developed and well-nourished.  HENT:  Head: Normocephalic and atraumatic.  Cardiovascular: Normal rate and regular rhythm. Exam reveals no gallop and no friction rub.  No murmur heard.  Pulmonary/Chest:  Breath sounds normal. She has no wheezes. She has no rales.  Neurological: She is alert and oriented to person, place, and time.  Skin: Skin is warm and dry.  Psychiatric: She has a normal mood and affect. Her behavior is  normal.         Assessment & Plan:  Asthma exacerbation.  Continue Dulera bid,  Finish prednisone,  Albuterol rescue prn  See me in 6 weeks to re-assess.  Cough nearly complete   See me in 6 weeks

## 2014-01-22 NOTE — Patient Instructions (Signed)
See me 30 mins middle of july

## 2014-02-06 ENCOUNTER — Ambulatory Visit: Payer: BC Managed Care – PPO | Admitting: Internal Medicine

## 2014-03-03 NOTE — Progress Notes (Signed)
Subjective:    Patient ID: Sarah Huber, female    DOB: Jan 27, 1956, 58 y.o.   MRN: [id number]  HPI  [name] is here for follow up of asthma exacerbation.  She was given Dulera  100/50  as maintenance therapy   Peak flow  320 today  She tells me she tried to go for 2 weeks without Dulera and she started wheezing  She has 2 dogs and a parrot in her house   She knows that parrot causes her to wheeze  No night time symptms  Last albuterol use 2 weeks ago  Allergies  Allergen Reactions  . Adhesive [Tape] Other (See Comments)    BLISTERS  . Hydrocodone Other (See Comments)    SEVERE HEADACHE  . Morphine And Related Other (See Comments)    SEVERE HEADACHE  . Aspirin Palpitations  . Penicillins Palpitations   Past Medical History  Diagnosis Date  . Headache(784.0)     migraines  . Seasonal allergies   . Personal history of asthma     "allergic asthma"  . History of hiatal hernia     repaired 2011  . GERD (gastroesophageal reflux disease)     occasionally; no current med.  . Hypothyroidism     s/p thyroidectomy  . Chronic abdominal pain     left side - states is scar tissue from previous surgeries  . Difficulty swallowing     food and pills  . 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 Complication of anesthesia     small airway; anesthesia awareness; states remembers things that occur during surgery  . Mass of axilla 11/2012    left  . Cancer     thyroid   Past Surgical History  Procedure Laterality Date  . Appendectomy    . Thyroidectomy    . Splenectomy, total    . Lipoma excision Left 10/19/2001    groin  . Esophagogastric fundoplasty  12/09/2009    laparoscopic takedown and mobilization of foregut  . Hiatal hernia repair  12/09/2009  . Nissen fundoplication      x 3  . Laparoscopic lysis of adhesions  01/27/2011    resection of scar tissue; wound exploration  . Total abdominal hysterectomy w/ bilateral salpingoophorectomy  07/30/2010    . Other surgical history  2004 - 2010    vocal cord surgery x 6  . Mass excision Left 12/20/2012    Procedure: EXCISION MASS LEFT AXILLA;  Surgeon: Merrie Roof, MD;  Location: [facility];  Service: General;  Laterality: Left;   History   Social History  . Marital Status: Married    Spouse Name: N/A    Number of Children: N/A  . Years of Education: N/A   Occupational History  . Not on file.   Social History Main Topics  . Smoking status: Current Some Day Smoker  . Smokeless tobacco: Never Used     Comment: quit smoking 2010  . Alcohol Use: 0.0 oz/week     Comment: occasionally  . Drug Use: No  . Sexual Activity: Yes   Other Topics Concern  . Not on file   Social History Narrative  . No narrative on file   Family History  Problem Relation Age of Onset  . Thyroid cancer Sister   . Colon cancer Maternal Grandfather   . Cancer Maternal Grandfather 6    colon   . Anesthesia problems Mother     "  hard time coming out"  . Hypertension Mother   . Hyperlipidemia Mother   . Alcohol abuse Father    Patient Active Problem List   Diagnosis Date Noted  . 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 S/P thyroidectomy 12/31/2013  . Asthma 12/27/2013  . Fibrocystic breast disease 12/27/2013  .  S/P total hysteretomy and BSO  precancerous cervical changes per pt 12/27/2013  .   Thyroid cancer  Dr. Tod Persia 12/27/2013  . GERD (gastroesophageal reflux disease) 12/27/2013  . Osteopenia 12/27/2013  . Hypothyroidism 12/27/2013  . Status post Nissen fundoplication 62/83/1517  . Breast density 12/27/2013  . Seroma complicating a procedure 12/27/2012  . Lipoma of axilla left 11/17/2012   Current Outpatient Prescriptions on File Prior to Visit  Medication Sig Dispense Refill  . albuterol (PROVENTIL HFA;VENTOLIN HFA) 108 (90 BASE) MCG/ACT inhaler 2 inhaltions tid prn wheezing or SOB  1 Inhaler  0  . amitriptyline (ELAVIL) 50 MG tablet Take 50 mg by mouth at bedtime.      .  butalbital-acetaminophen-caffeine (FIORICET, ESGIC) 50-325-40 MG per tablet Take 1 tablet by mouth 2 (two) times daily as needed for headache.      . calcium carbonate (OS-CAL) 600 MG TABS Take 600 mg by mouth 2 (two) times daily with a meal.      . chlorpheniramine-HYDROcodone (TUSSIONEX PENNKINETIC ER) 10-8 MG/5ML LQCR Take 5 mLs by mouth every 12 (twelve) hours as needed for cough.  240 mL  0  . cholecalciferol (VITAMIN D) 1000 UNITS tablet Take 1,000 Units by mouth daily.      Marland Kitchen conjugated estrogens (PREMARIN) vaginal cream Place vaginally daily. 3 x/week      . fish oil-omega-3 fatty acids 1000 MG capsule Take 2 g by mouth daily.      Marland Kitchen levothyroxine (SYNTHROID, LEVOTHROID) 175 MCG tablet Take 175 mcg by mouth daily. 175 mcg daily, except on Sundays and then take 175 mcg +1/2 tablet.      . mometasone-formoterol (DULERA) 100-5 MCG/ACT AERO Inhale 2 puffs into the lungs 2 (two) times daily.      . Multiple Vitamin (MULTIVITAMIN) tablet Take 1 tablet by mouth daily.      . polycarbophil (FIBERCON) 625 MG tablet Take 625 mg by mouth daily.      . predniSONE (DELTASONE) 10 MG tablet Take one tablet daily for 7 days then stop  7 tablet  0  . promethazine (PHENERGAN) 25 MG tablet Take 25 mg by mouth as needed.       . traMADol (ULTRAM) 50 MG tablet Take 1-2 tablets (50-100 mg total) by mouth every 6 (six) hours as needed for pain.  30 tablet  1  . triamterene-hydrochlorothiazide (MAXZIDE) 75-50 MG per tablet Take 1 tablet by mouth daily.      . vitamin C (ASCORBIC ACID) 500 MG tablet Take 500 mg by mouth daily.       No current facility-administered medications on file prior to visit.       Review of Systems See HPI    Objective:   Physical Exam  Physical Exam  Nursing note and vitals reviewed.   Peak flow 320 Constitutional: She is oriented to person, place, and time. She appears well-developed and well-nourished.  HENT:  Head: Normocephalic and atraumatic.  Cardiovascular: Normal  rate and regular rhythm. Exam reveals no gallop and no friction rub.  No murmur heard.  Pulmonary/Chest: Breath sounds normal. She has no wheezes. She has no rales.  Neurological: She is alert and oriented to  person, place, and time.  Skin: Skin is warm and dry.  Psychiatric: She has a normal mood and affect. Her behavior is normal.        Assessment & Plan:  Extrinisic asthma :  Advised to stay on Dulera 2 inhaltions bid.  Albuterol  Rescue prn  Allergic rhinitis:     Will set up referral for allergy testing  Schedule CPE

## 2014-03-04 ENCOUNTER — Ambulatory Visit (INDEPENDENT_AMBULATORY_CARE_PROVIDER_SITE_OTHER): Payer: 59 | Admitting: Internal Medicine

## 2014-03-04 ENCOUNTER — Encounter: Payer: Self-pay | Admitting: Internal Medicine

## 2014-03-04 VITALS — BP 107/70 | HR 90 | Resp 16 | Ht 65.0 in | Wt 145.0 lb

## 2014-03-04 DIAGNOSIS — J4540 Moderate persistent asthma, uncomplicated: Secondary | ICD-10-CM

## 2014-03-04 DIAGNOSIS — J309 Allergic rhinitis, unspecified: Secondary | ICD-10-CM | POA: Insufficient documentation

## 2014-03-04 DIAGNOSIS — J3089 Other allergic rhinitis: Secondary | ICD-10-CM

## 2014-03-04 DIAGNOSIS — J45909 Unspecified asthma, uncomplicated: Secondary | ICD-10-CM

## 2014-03-04 MED ORDER — MOMETASONE FURO-FORMOTEROL FUM 100-5 MCG/ACT IN AERO: 2.0000 | INHALATION_SPRAY | Freq: Two times a day (BID) | RESPIRATORY_TRACT | Status: AC

## 2014-03-04 NOTE — Patient Instructions (Signed)
Schedule CPE in 08/2014

## 2014-06-24 ENCOUNTER — Encounter: Payer: Self-pay | Admitting: Internal Medicine

## 2014-07-01 ENCOUNTER — Ambulatory Visit (INDEPENDENT_AMBULATORY_CARE_PROVIDER_SITE_OTHER): Payer: 59 | Admitting: Internal Medicine

## 2014-07-01 ENCOUNTER — Encounter: Payer: Self-pay | Admitting: Internal Medicine

## 2014-07-01 VITALS — BP 135/80 | HR 84 | Temp 97.8°F | Resp 16 | Ht 65.0 in | Wt 139.0 lb

## 2014-07-01 DIAGNOSIS — G4700 Insomnia, unspecified: Secondary | ICD-10-CM

## 2014-07-01 DIAGNOSIS — R21 Rash and other nonspecific skin eruption: Secondary | ICD-10-CM

## 2014-07-01 DIAGNOSIS — L250 Unspecified contact dermatitis due to cosmetics: Secondary | ICD-10-CM

## 2014-07-01 MED ORDER — HYDROXYZINE HCL 10 MG PO TABS: ORAL_TABLET | ORAL | Status: DC

## 2014-07-01 MED ORDER — PREDNISONE 20 MG PO TABS: ORAL_TABLET | ORAL | Status: DC

## 2014-07-01 MED ORDER — METHYLPREDNISOLONE ACETATE 80 MG/ML IJ SUSP
160.0000 mg | Freq: Once | INTRAMUSCULAR | Status: AC
  Administered 2014-07-01: 160 mg via INTRAMUSCULAR

## 2014-07-01 MED ORDER — LORAZEPAM 1 MG PO TABS: ORAL_TABLET | ORAL | Status: DC

## 2014-07-01 NOTE — Progress Notes (Signed)
Subjective:    Patient ID: Sarah Huber, female    DOB: Oct 25, 1955, 58 y.o.   MRN: [id number]  HPI  Sarah Huber is here for acute visit for itchy facial rash.    Began 1 week ago after using a new  Clarins cleanser on her face.  Began with patchy urticarial lesions limited to facial area. Was seen at [facility] and given prednisone dose pack and HC 2.5% creme.  She only took two days of prednisone and stopped as she was afraid of weight gain and insomnia.  3 days ago facial urticaria and redness returned.     Allergies  Allergen Reactions  . Adhesive [Tape] Other (See Comments)    BLISTERS  . Hydrocodone Other (See Comments)    SEVERE HEADACHE  . Morphine And Related Other (See Comments)    SEVERE HEADACHE  . Aspirin Palpitations  . Penicillins Palpitations   Past Medical History  Diagnosis Date  . Headache(784.0)     migraines  . Seasonal allergies   . Personal history of asthma     "allergic asthma"  . History of hiatal hernia     repaired 2011  . GERD (gastroesophageal reflux disease)     occasionally; no current med.  . Hypothyroidism     s/p thyroidectomy  . Chronic abdominal pain     left side - states is scar tissue from previous surgeries  . Difficulty swallowing     food and pills  . 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 Complication of anesthesia     small airway; anesthesia awareness; states remembers things that occur during surgery  . Mass of axilla 11/2012    left  . Cancer     thyroid   Past Surgical History  Procedure Laterality Date  . Appendectomy    . Thyroidectomy    . Splenectomy, total    . Lipoma excision Left 10/19/2001    groin  . Esophagogastric fundoplasty  12/09/2009    laparoscopic takedown and mobilization of foregut  . Hiatal hernia repair  12/09/2009  . Nissen fundoplication      x 3  . Laparoscopic lysis of adhesions  01/27/2011    resection of scar tissue; wound exploration  . Total abdominal  hysterectomy w/ bilateral salpingoophorectomy  07/30/2010  . Other surgical history  2004 - 2010    vocal cord surgery x 6  . Mass excision Left 12/20/2012    Procedure: EXCISION MASS LEFT AXILLA;  Surgeon: Merrie Roof, MD;  Location: [place];  Service: General;  Laterality: Left;   History   Social History  . Marital Status: Married    Spouse Name: N/A    Number of Children: N/A  . Years of Education: N/A   Occupational History  . Not on file.   Social History Main Topics  . Smoking status: Current Some Day Smoker  . Smokeless tobacco: Never Used     Comment: quit smoking 2010  . Alcohol Use: 0.0 oz/week     Comment: occasionally  . Drug Use: No  . Sexual Activity: Yes   Other Topics Concern  . Not on file   Social History Narrative   Family History  Problem Relation Age of Onset  . Thyroid cancer Sister   . Colon cancer Maternal Grandfather   . Cancer Maternal Grandfather 26    colon   . Anesthesia problems Mother     "  hard time coming out"  . Hypertension Mother   . Hyperlipidemia Mother   . Alcohol abuse Father    Patient Active Problem List   Diagnosis Date Noted  . Allergic rhinitis 03/04/2014  . 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 S/P thyroidectomy 12/31/2013  . Asthma 12/27/2013  . Fibrocystic breast disease 12/27/2013  .  S/P total hysteretomy and BSO  precancerous cervical changes per pt 12/27/2013  .   Thyroid cancer  Dr. Tod Persia 12/27/2013  . GERD (gastroesophageal reflux disease) 12/27/2013  . Osteopenia 12/27/2013  . Hypothyroidism 12/27/2013  . Status post Nissen fundoplication 37/11/8887  . Breast density 12/27/2013  . Seroma complicating a procedure 12/27/2012  . Lipoma of axilla left 11/17/2012   Current Outpatient Prescriptions on File Prior to Visit  Medication Sig Dispense Refill  . albuterol (PROVENTIL HFA;VENTOLIN HFA) 108 (90 BASE) MCG/ACT inhaler 2 inhaltions tid prn wheezing or SOB 1 Inhaler 0  . amitriptyline  (ELAVIL) 50 MG tablet Take 50 mg by mouth at bedtime.    . butalbital-acetaminophen-caffeine (FIORICET, ESGIC) 50-325-40 MG per tablet Take 1 tablet by mouth 2 (two) times daily as needed for headache.    . calcium carbonate (OS-CAL) 600 MG TABS Take 600 mg by mouth 2 (two) times daily with a meal.    . cholecalciferol (VITAMIN D) 1000 UNITS tablet Take 1,000 Units by mouth daily.    Marland Kitchen conjugated estrogens (PREMARIN) vaginal cream Place vaginally daily. 3 x/week    . fish oil-omega-3 fatty acids 1000 MG capsule Take 2 g by mouth daily.    Marland Kitchen levothyroxine (SYNTHROID, LEVOTHROID) 175 MCG tablet Take 175 mcg by mouth daily. 175 mcg daily, except on Sundays and then take 175 mcg +1/2 tablet.    . mometasone-formoterol (DULERA) 100-5 MCG/ACT AERO Inhale 2 puffs into the lungs 2 (two) times daily. 1 Inhaler 2  . Multiple Vitamin (MULTIVITAMIN) tablet Take 1 tablet by mouth daily.    . polycarbophil (FIBERCON) 625 MG tablet Take 625 mg by mouth daily.    . promethazine (PHENERGAN) 25 MG tablet Take 25 mg by mouth as needed.     . traMADol (ULTRAM) 50 MG tablet Take 1-2 tablets (50-100 mg total) by mouth every 6 (six) hours as needed for pain. 30 tablet 1  . triamterene-hydrochlorothiazide (MAXZIDE) 75-50 MG per tablet Take 1 tablet by mouth daily.    . vitamin C (ASCORBIC ACID) 500 MG tablet Take 500 mg by mouth daily.     No current facility-administered medications on file prior to visit.      Review of Systems See HPI    Objective:   Physical Exam Physical Exam  Nursing note and vitals reviewed.  Constitutional: She is oriented to person, place, and time. She appears well-developed and well-nourished.  HENT:  Head: Normocephalic and atraumatic.  Cardiovascular: Normal rate and regular rhythm. Exam reveals no gallop and no friction rub.  No murmur heard.  Pulmonary/Chest: Breath sounds normal. She has no wheezes. She has no rales.  Neurological: She is alert and oriented to person, place,  and time.  Skin: Skin is warm and dry. Scaling of upper eyelids.  Red resolving urticarial lesion sides of cheeks.  No other rash anywhere on her.   Psychiatric: She has a normal mood and affect. Her behavior is normal.        Assessment & Plan:  Contact dermatitis:    I suspect clarins cleanser and pt stopped steroid to early so uriticare dermatitis only partially treated.  Will give Depomedrol 160 mg in office.  Prednisone 60 mg taper q 3 days.  Continue HC 2.5 % bid   Insomnia:  Ok for Ativan if problems sleeping    Itching Atarax 10 mg bid   Call me if needed

## 2014-07-08 ENCOUNTER — Inpatient Hospital Stay (HOSPITAL_BASED_OUTPATIENT_CLINIC_OR_DEPARTMENT_OTHER)
Admission: EM | Admit: 2014-07-08 | Discharge: 2014-07-12 | DRG: 202 | Disposition: A | Discharge status: Home Health | Payer: 59 | Attending: Internal Medicine | Admitting: Internal Medicine

## 2014-07-08 ENCOUNTER — Ambulatory Visit (HOSPITAL_BASED_OUTPATIENT_CLINIC_OR_DEPARTMENT_OTHER)
Admission: RE | Admit: 2014-07-08 | Discharge: 2014-07-08 | Disposition: A | Discharge status: Home / Self Care | Payer: 59 | Source: Ambulatory Visit | Attending: Internal Medicine | Admitting: Internal Medicine

## 2014-07-08 ENCOUNTER — Encounter: Payer: Self-pay | Admitting: Internal Medicine

## 2014-07-08 ENCOUNTER — Ambulatory Visit (INDEPENDENT_AMBULATORY_CARE_PROVIDER_SITE_OTHER): Payer: 59 | Admitting: Internal Medicine

## 2014-07-08 ENCOUNTER — Encounter (HOSPITAL_BASED_OUTPATIENT_CLINIC_OR_DEPARTMENT_OTHER): Payer: Self-pay | Admitting: *Deleted

## 2014-07-08 ENCOUNTER — Telehealth: Payer: Self-pay

## 2014-07-08 ENCOUNTER — Other Ambulatory Visit: Payer: Self-pay | Admitting: Internal Medicine

## 2014-07-08 VITALS — BP 118/78 | HR 101 | Temp 98.7°F | Resp 24 | Ht 65.5 in | Wt 140.0 lb

## 2014-07-08 DIAGNOSIS — Z800 Family history of malignant neoplasm of digestive organs: Secondary | ICD-10-CM | POA: Diagnosis not present

## 2014-07-08 DIAGNOSIS — T380X5A Adverse effect of glucocorticoids and synthetic analogues, initial encounter: Secondary | ICD-10-CM | POA: Diagnosis present

## 2014-07-08 DIAGNOSIS — E039 Hypothyroidism, unspecified: Secondary | ICD-10-CM | POA: Diagnosis present

## 2014-07-08 DIAGNOSIS — R0602 Shortness of breath: Secondary | ICD-10-CM

## 2014-07-08 DIAGNOSIS — Z9109 Other allergy status, other than to drugs and biological substances: Secondary | ICD-10-CM | POA: Diagnosis not present

## 2014-07-08 DIAGNOSIS — R059 Cough, unspecified: Secondary | ICD-10-CM

## 2014-07-08 DIAGNOSIS — Z808 Family history of malignant neoplasm of other organs or systems: Secondary | ICD-10-CM

## 2014-07-08 DIAGNOSIS — C73 Malignant neoplasm of thyroid gland: Secondary | ICD-10-CM | POA: Diagnosis present

## 2014-07-08 DIAGNOSIS — Z886 Allergy status to analgesic agent status: Secondary | ICD-10-CM | POA: Diagnosis not present

## 2014-07-08 DIAGNOSIS — Z79899 Other long term (current) drug therapy: Secondary | ICD-10-CM | POA: Diagnosis not present

## 2014-07-08 DIAGNOSIS — K219 Gastro-esophageal reflux disease without esophagitis: Secondary | ICD-10-CM | POA: Diagnosis present

## 2014-07-08 DIAGNOSIS — J45901 Unspecified asthma with (acute) exacerbation: Secondary | ICD-10-CM

## 2014-07-08 DIAGNOSIS — R1032 Left lower quadrant pain: Secondary | ICD-10-CM | POA: Diagnosis present

## 2014-07-08 DIAGNOSIS — E890 Postprocedural hypothyroidism: Secondary | ICD-10-CM | POA: Diagnosis present

## 2014-07-08 DIAGNOSIS — F419 Anxiety disorder, unspecified: Secondary | ICD-10-CM | POA: Diagnosis present

## 2014-07-08 DIAGNOSIS — F17210 Nicotine dependence, cigarettes, uncomplicated: Secondary | ICD-10-CM | POA: Diagnosis present

## 2014-07-08 DIAGNOSIS — J209 Acute bronchitis, unspecified: Principal | ICD-10-CM | POA: Diagnosis present

## 2014-07-08 DIAGNOSIS — Z79891 Long term (current) use of opiate analgesic: Secondary | ICD-10-CM | POA: Diagnosis not present

## 2014-07-08 DIAGNOSIS — Z885 Allergy status to narcotic agent status: Secondary | ICD-10-CM | POA: Diagnosis not present

## 2014-07-08 DIAGNOSIS — J208 Acute bronchitis due to other specified organisms: Secondary | ICD-10-CM

## 2014-07-08 DIAGNOSIS — Z8249 Family history of ischemic heart disease and other diseases of the circulatory system: Secondary | ICD-10-CM

## 2014-07-08 DIAGNOSIS — K5900 Constipation, unspecified: Secondary | ICD-10-CM | POA: Diagnosis present

## 2014-07-08 DIAGNOSIS — R739 Hyperglycemia, unspecified: Secondary | ICD-10-CM | POA: Diagnosis present

## 2014-07-08 DIAGNOSIS — J45902 Unspecified asthma with status asthmaticus: Secondary | ICD-10-CM | POA: Diagnosis present

## 2014-07-08 DIAGNOSIS — Z880 Allergy status to penicillin: Secondary | ICD-10-CM

## 2014-07-08 DIAGNOSIS — R05 Cough: Secondary | ICD-10-CM

## 2014-07-08 DIAGNOSIS — R109 Unspecified abdominal pain: Secondary | ICD-10-CM

## 2014-07-08 DIAGNOSIS — D72829 Elevated white blood cell count, unspecified: Secondary | ICD-10-CM

## 2014-07-08 DIAGNOSIS — R062 Wheezing: Secondary | ICD-10-CM | POA: Diagnosis not present

## 2014-07-08 DIAGNOSIS — J4542 Moderate persistent asthma with status asthmaticus: Secondary | ICD-10-CM

## 2014-07-08 LAB — BASIC METABOLIC PANEL
Anion gap: 14 (ref 5–15)
BUN: 24 mg/dL — ABNORMAL HIGH (ref 6–23)
CALCIUM: 9.5 mg/dL (ref 8.4–10.5)
CHLORIDE: 99 meq/L (ref 96–112)
CO2: 25 meq/L (ref 19–32)
Creatinine, Ser: 0.9 mg/dL (ref 0.50–1.10)
GFR calc Af Amer: 81 mL/min — ABNORMAL LOW (ref >90)
GFR calc non Af Amer: 70 mL/min — ABNORMAL LOW (ref >90)
Glucose, Bld: 142 mg/dL — ABNORMAL HIGH (ref 70–99)
Potassium: 4.3 mEq/L (ref 3.7–5.3)
SODIUM: 138 meq/L (ref 137–147)

## 2014-07-08 LAB — CBC
HCT: 37.6 % (ref 36.0–46.0)
Hemoglobin: 12.6 g/dL (ref 12.0–15.0)
MCH: 31.7 pg (ref 26.0–34.0)
MCHC: 33.5 g/dL (ref 30.0–36.0)
MCV: 94.5 fL (ref 78.0–100.0)
Platelets: 349 10*3/uL (ref 150–400)
RBC: 3.98 MIL/uL (ref 3.87–5.11)
RDW: 13.3 % (ref 11.5–15.5)
WBC: 18.8 10*3/uL — AB (ref 4.0–10.5)

## 2014-07-08 LAB — CBC WITH DIFFERENTIAL/PLATELET
Basophils Absolute: 0 10*3/uL (ref 0.0–0.1)
Basophils Relative: 0 % (ref 0–1)
Eosinophils Absolute: 0 10*3/uL (ref 0.0–0.7)
Eosinophils Relative: 0 % (ref 0–5)
HEMATOCRIT: 41.3 % (ref 36.0–46.0)
HEMOGLOBIN: 14.1 g/dL (ref 12.0–15.0)
Lymphocytes Relative: 7 % — ABNORMAL LOW (ref 12–46)
Lymphs Abs: 1.5 10*3/uL (ref 0.7–4.0)
MCH: 31.9 pg (ref 26.0–34.0)
MCHC: 34.1 g/dL (ref 30.0–36.0)
MCV: 93.4 fL (ref 78.0–100.0)
MONO ABS: 1.2 10*3/uL — AB (ref 0.1–1.0)
Monocytes Relative: 6 % (ref 3–12)
Neutro Abs: 17.5 10*3/uL — ABNORMAL HIGH (ref 1.7–7.7)
Neutrophils Relative %: 87 % — ABNORMAL HIGH (ref 43–77)
Platelets: 352 10*3/uL (ref 150–400)
RBC: 4.42 MIL/uL (ref 3.87–5.11)
RDW: 13.4 % (ref 11.5–15.5)
WBC: 20.2 10*3/uL — AB (ref 4.0–10.5)

## 2014-07-08 LAB — CREATININE, SERUM
Creatinine, Ser: 0.84 mg/dL (ref 0.50–1.10)
GFR calc non Af Amer: 76 mL/min — ABNORMAL LOW (ref >90)
GFR, EST AFRICAN AMERICAN: 88 mL/min — AB (ref >90)

## 2014-07-08 MED ORDER — MOMETASONE FURO-FORMOTEROL FUM 100-5 MCG/ACT IN AERO
2.0000 | INHALATION_SPRAY | Freq: Two times a day (BID) | RESPIRATORY_TRACT | Status: DC
Start: 2014-07-08 — End: 2014-07-09
  Administered 2014-07-08: 2 via RESPIRATORY_TRACT
  Filled 2014-07-08: qty 8.8

## 2014-07-08 MED ORDER — PROMETHAZINE HCL 25 MG PO TABS
25.0000 mg | ORAL_TABLET | Freq: Four times a day (QID) | ORAL | Status: DC | PRN
  Administered 2014-07-08 – 2014-07-11 (×5): 25 mg via ORAL
  Filled 2014-07-08 (×5): qty 1

## 2014-07-08 MED ORDER — HYDROXYZINE HCL 25 MG PO TABS
25.0000 mg | ORAL_TABLET | Freq: Four times a day (QID) | ORAL | Status: DC | PRN
  Administered 2014-07-09 – 2014-07-10 (×3): 25 mg via ORAL
  Filled 2014-07-08 (×4): qty 1

## 2014-07-08 MED ORDER — BUTALBITAL-APAP-CAFFEINE 50-325-40 MG PO TABS
1.0000 | ORAL_TABLET | Freq: Two times a day (BID) | ORAL | Status: DC | PRN
  Administered 2014-07-08 – 2014-07-10 (×3): 1 via ORAL
  Filled 2014-07-08 (×3): qty 1

## 2014-07-08 MED ORDER — ALBUTEROL SULFATE (2.5 MG/3ML) 0.083% IN NEBU
5.0000 mg | INHALATION_SOLUTION | Freq: Once | RESPIRATORY_TRACT | Status: DC
  Filled 2014-07-08: qty 6

## 2014-07-08 MED ORDER — OMEGA-3 FATTY ACIDS 1000 MG PO CAPS: 2.0000 g | ORAL_CAPSULE | Freq: Every day | ORAL | Status: DC

## 2014-07-08 MED ORDER — LEVOFLOXACIN IN D5W 750 MG/150ML IV SOLN
750.0000 mg | Freq: Once | INTRAVENOUS | Status: AC
  Administered 2014-07-08: 750 mg via INTRAVENOUS
  Filled 2014-07-08: qty 150

## 2014-07-08 MED ORDER — ALBUTEROL SULFATE (2.5 MG/3ML) 0.083% IN NEBU: 2.5000 mg | INHALATION_SOLUTION | Freq: Four times a day (QID) | RESPIRATORY_TRACT | Status: DC

## 2014-07-08 MED ORDER — IPRATROPIUM-ALBUTEROL 0.5-2.5 (3) MG/3ML IN SOLN
3.0000 mL | Freq: Four times a day (QID) | RESPIRATORY_TRACT | Status: DC
  Administered 2014-07-08: 3 mL via RESPIRATORY_TRACT
  Filled 2014-07-08: qty 3

## 2014-07-08 MED ORDER — DOCUSATE SODIUM 100 MG PO CAPS
100.0000 mg | ORAL_CAPSULE | Freq: Two times a day (BID) | ORAL | Status: DC
  Administered 2014-07-08 – 2014-07-12 (×8): 100 mg via ORAL
  Filled 2014-07-08 (×9): qty 1

## 2014-07-08 MED ORDER — ALBUTEROL (5 MG/ML) CONTINUOUS INHALATION SOLN
10.0000 mg/h | INHALATION_SOLUTION | Freq: Once | RESPIRATORY_TRACT | Status: AC
  Administered 2014-07-08: 10 mg/h via RESPIRATORY_TRACT
  Filled 2014-07-08: qty 20

## 2014-07-08 MED ORDER — LORAZEPAM 0.5 MG PO TABS
0.5000 mg | ORAL_TABLET | Freq: Every evening | ORAL | Status: DC | PRN
  Administered 2014-07-08 – 2014-07-11 (×4): 0.5 mg via ORAL
  Filled 2014-07-08 (×4): qty 1

## 2014-07-08 MED ORDER — FOLIC ACID 1 MG PO TABS
1.0000 mg | ORAL_TABLET | Freq: Every day | ORAL | Status: DC
Start: 2014-07-08 — End: 2014-07-12
  Administered 2014-07-09 – 2014-07-12 (×4): 1 mg via ORAL
  Filled 2014-07-08 (×5): qty 1

## 2014-07-08 MED ORDER — VITAMIN D3 25 MCG (1000 UNIT) PO TABS
1000.0000 [IU] | ORAL_TABLET | Freq: Every day | ORAL | Status: DC
  Administered 2014-07-09 – 2014-07-12 (×4): 1000 [IU] via ORAL
  Filled 2014-07-08 (×5): qty 1

## 2014-07-08 MED ORDER — ADULT MULTIVITAMIN W/MINERALS CH
1.0000 | ORAL_TABLET | Freq: Every day | ORAL | Status: DC
  Administered 2014-07-09 – 2014-07-12 (×4): 1 via ORAL
  Filled 2014-07-08 (×5): qty 1

## 2014-07-08 MED ORDER — OMEGA-3-ACID ETHYL ESTERS 1 G PO CAPS
1.0000 g | ORAL_CAPSULE | Freq: Two times a day (BID) | ORAL | Status: DC
  Administered 2014-07-09 – 2014-07-12 (×6): 1 g via ORAL
  Filled 2014-07-08 (×9): qty 1

## 2014-07-08 MED ORDER — IPRATROPIUM BROMIDE 0.02 % IN SOLN
0.5000 mg | Freq: Once | RESPIRATORY_TRACT | Status: DC
  Filled 2014-07-08: qty 2.5

## 2014-07-08 MED ORDER — TRAMADOL HCL 50 MG PO TABS
50.0000 mg | ORAL_TABLET | Freq: Four times a day (QID) | ORAL | Status: DC | PRN
  Administered 2014-07-09 – 2014-07-12 (×9): 50 mg via ORAL
  Filled 2014-07-08 (×9): qty 1

## 2014-07-08 MED ORDER — CALCIUM CARBONATE 600 MG PO TABS: 600.0000 mg | ORAL_TABLET | Freq: Two times a day (BID) | ORAL | Status: DC

## 2014-07-08 MED ORDER — OXYCODONE HCL 5 MG PO TABS
5.0000 mg | ORAL_TABLET | ORAL | Status: DC | PRN
  Administered 2014-07-08 – 2014-07-09 (×3): 5 mg via ORAL
  Filled 2014-07-08 (×3): qty 1

## 2014-07-08 MED ORDER — VITAMIN C 500 MG PO TABS
500.0000 mg | ORAL_TABLET | Freq: Every day | ORAL | Status: DC
  Administered 2014-07-09 – 2014-07-12 (×4): 500 mg via ORAL
  Filled 2014-07-08 (×5): qty 1

## 2014-07-08 MED ORDER — TRIAMTERENE-HCTZ 75-50 MG PO TABS
1.0000 | ORAL_TABLET | Freq: Every day | ORAL | Status: DC
  Administered 2014-07-09 – 2014-07-12 (×4): 1 via ORAL
  Filled 2014-07-08 (×4): qty 1

## 2014-07-08 MED ORDER — LEVOFLOXACIN IN D5W 750 MG/150ML IV SOLN
750.0000 mg | INTRAVENOUS | Status: DC
  Administered 2014-07-09 – 2014-07-10 (×2): 750 mg via INTRAVENOUS
  Filled 2014-07-08 (×2): qty 150

## 2014-07-08 MED ORDER — ACETAMINOPHEN 325 MG PO TABS: 650.0000 mg | ORAL_TABLET | Freq: Four times a day (QID) | ORAL | Status: DC | PRN

## 2014-07-08 MED ORDER — METHYLPREDNISOLONE SODIUM SUCC 40 MG IJ SOLR
40.0000 mg | Freq: Four times a day (QID) | INTRAMUSCULAR | Status: DC
  Administered 2014-07-08 – 2014-07-09 (×4): 40 mg via INTRAVENOUS
  Filled 2014-07-08 (×6): qty 1

## 2014-07-08 MED ORDER — SODIUM CHLORIDE 0.9 % IV SOLN
INTRAVENOUS | Status: DC
  Administered 2014-07-08 – 2014-07-11 (×3): via INTRAVENOUS

## 2014-07-08 MED ORDER — ALBUTEROL SULFATE (2.5 MG/3ML) 0.083% IN NEBU
2.5000 mg | INHALATION_SOLUTION | Freq: Once | RESPIRATORY_TRACT | Status: AC
  Administered 2014-07-08: 2.5 mg via RESPIRATORY_TRACT

## 2014-07-08 MED ORDER — CALCIUM CARBONATE 1250 (500 CA) MG PO TABS
1.0000 | ORAL_TABLET | Freq: Two times a day (BID) | ORAL | Status: DC
  Administered 2014-07-09 – 2014-07-12 (×7): 500 mg via ORAL
  Filled 2014-07-08 (×9): qty 1

## 2014-07-08 MED ORDER — ALBUTEROL SULFATE (2.5 MG/3ML) 0.083% IN NEBU
5.0000 mg | INHALATION_SOLUTION | Freq: Once | RESPIRATORY_TRACT | Status: AC
  Administered 2014-07-08: 5 mg via RESPIRATORY_TRACT
  Filled 2014-07-08: qty 6

## 2014-07-08 MED ORDER — INFLUENZA VAC SPLIT QUAD 0.5 ML IM SUSY
0.5000 mL | PREFILLED_SYRINGE | INTRAMUSCULAR | Status: AC
  Administered 2014-07-09: 0.5 mL via INTRAMUSCULAR
  Filled 2014-07-08 (×2): qty 0.5

## 2014-07-08 MED ORDER — CALCIUM POLYCARBOPHIL 625 MG PO TABS
625.0000 mg | ORAL_TABLET | Freq: Every day | ORAL | Status: DC
  Administered 2014-07-09 – 2014-07-11 (×3): 625 mg via ORAL
  Filled 2014-07-08 (×9): qty 1

## 2014-07-08 MED ORDER — METHYLPREDNISOLONE SODIUM SUCC 125 MG IJ SOLR
125.0000 mg | Freq: Once | INTRAMUSCULAR | Status: AC
  Administered 2014-07-08: 125 mg via INTRAVENOUS
  Filled 2014-07-08: qty 2

## 2014-07-08 MED ORDER — ESTROGENS, CONJUGATED 0.625 MG/GM VA CREA
1.0000 | TOPICAL_CREAM | VAGINAL | Status: DC
  Administered 2014-07-09 – 2014-07-11 (×2): 1 via VAGINAL
  Filled 2014-07-08: qty 30

## 2014-07-08 MED ORDER — IPRATROPIUM-ALBUTEROL 0.5-2.5 (3) MG/3ML IN SOLN
3.0000 mL | Freq: Once | RESPIRATORY_TRACT | Status: AC
  Administered 2014-07-08: 3 mL via RESPIRATORY_TRACT

## 2014-07-08 MED ORDER — ACETAMINOPHEN 650 MG RE SUPP: 650.0000 mg | Freq: Four times a day (QID) | RECTAL | Status: DC | PRN

## 2014-07-08 MED ORDER — AMITRIPTYLINE HCL 50 MG PO TABS
50.0000 mg | ORAL_TABLET | Freq: Every day | ORAL | Status: DC
  Administered 2014-07-08 – 2014-07-11 (×4): 50 mg via ORAL
  Filled 2014-07-08 (×5): qty 1

## 2014-07-08 MED ORDER — MAGNESIUM SULFATE 2 GM/50ML IV SOLN
2.0000 g | Freq: Once | INTRAVENOUS | Status: AC
  Administered 2014-07-08: 2 g via INTRAVENOUS
  Filled 2014-07-08: qty 50

## 2014-07-08 MED ORDER — IPRATROPIUM BROMIDE 0.02 % IN SOLN: 0.5000 mg | Freq: Four times a day (QID) | RESPIRATORY_TRACT | Status: DC

## 2014-07-08 MED ORDER — BISACODYL 10 MG RE SUPP: 10.0000 mg | Freq: Every day | RECTAL | Status: DC | PRN

## 2014-07-08 MED ORDER — ALUM & MAG HYDROXIDE-SIMETH 200-200-20 MG/5ML PO SUSP: 30.0000 mL | Freq: Four times a day (QID) | ORAL | Status: DC | PRN

## 2014-07-08 MED ORDER — HEPARIN SODIUM (PORCINE) 5000 UNIT/ML IJ SOLN
5000.0000 [IU] | Freq: Three times a day (TID) | INTRAMUSCULAR | Status: DC
  Administered 2014-07-08 – 2014-07-12 (×11): 5000 [IU] via SUBCUTANEOUS
  Filled 2014-07-08 (×14): qty 1

## 2014-07-08 MED ORDER — HYDROMORPHONE HCL 1 MG/ML IJ SOLN
0.5000 mg | Freq: Once | INTRAMUSCULAR | Status: AC
  Administered 2014-07-08: 0.5 mg via INTRAVENOUS
  Filled 2014-07-08: qty 1

## 2014-07-08 MED ORDER — HYDROMORPHONE HCL 1 MG/ML IJ SOLN
1.0000 mg | Freq: Once | INTRAMUSCULAR | Status: AC
  Administered 2014-07-08: 1 mg via INTRAVENOUS
  Filled 2014-07-08: qty 1

## 2014-07-08 MED ORDER — MAGNESIUM CITRATE PO SOLN: 1.0000 | Freq: Once | ORAL | Status: AC | PRN

## 2014-07-08 MED ORDER — VITAMIN B-1 100 MG PO TABS
100.0000 mg | ORAL_TABLET | Freq: Every day | ORAL | Status: DC
  Administered 2014-07-09 – 2014-07-12 (×4): 100 mg via ORAL
  Filled 2014-07-08 (×5): qty 1

## 2014-07-08 MED ORDER — LEVOTHYROXINE SODIUM 175 MCG PO TABS
175.0000 ug | ORAL_TABLET | Freq: Every day | ORAL | Status: DC
  Administered 2014-07-09 – 2014-07-12 (×4): 175 ug via ORAL
  Filled 2014-07-08 (×5): qty 1

## 2014-07-08 NOTE — H&P (Signed)
[facility] History and Physical  Sarah Huber BWI:[id number] DOB: 1956-05-04 DOA: 07/08/2014  Referring physician: [name], MD PCP: [name], MD   Chief Complaint: Status Asthmaticus  HPI: Sarah Huber is a 58 y.o. female who presents with status asthmaticus. Patient states that since last Tuesday she has noted increased difficulty breathing. She states that she has had a cough and also has felt feverish. She has had no hemoptysis. She states that she notes wheezing. She states in addition she had body aches. She is on inhalers but is not sure if she has a formal diagnosis of asthma. She does smoke socially alos. She was seen in [facility] and was sent here for admission. She has no infiltrate noted on her CXR but she does have some tightness and pain in her chest from coughing. On evaluation here she looks anxious but is comfortable. She states that she is very shaky because she had been given several rounds of albuterol and MCHP.    Review of Systems:  Constitutional:  No weight loss, night sweats, ++Fevers, chills, ++fatigue.  HEENT:  ++headaches, ++sneezing, no itching, ear ache, nasal congestion, post nasal drip,  Cardio-vascular:  ++chest tightness, NO Orthopnea, PND, swelling in lower extremities, dizziness  GI:  No heartburn, indigestion, abdominal pain, nausea, vomiting, diarrhea, change in bowel habits  Resp:  ++shortness of breath with exertion and at rest. ++non-productive cough, No coughing up of blood ++wheezing  Skin:  no rash or lesions.  GU:  no dysuria, change in color of urine, no urgency or frequency Musculoskeletal:  No joint pain or swelling. No decreased range of motion  Psych:  No change in mood or affect. No depression ++anxiety.   Past Medical History  Diagnosis Date  . Headache(784.0)     migraines  . Seasonal allergies   . Personal history of asthma     "allergic asthma"  . History of hiatal hernia     repaired 2011  . GERD  (gastroesophageal reflux disease)     occasionally; no current med.  . Hypothyroidism     s/p thyroidectomy  . Chronic abdominal pain     left side - states is scar tissue from previous surgeries  . Difficulty swallowing     food and pills  . 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 Complication of anesthesia     small airway; anesthesia awareness; states remembers things that occur during surgery  . Mass of axilla 11/2012    left  . Cancer     thyroid   Past Surgical History  Procedure Laterality Date  . Appendectomy    . Thyroidectomy    . Splenectomy, total    . Lipoma excision Left 10/19/2001    groin  . Esophagogastric fundoplasty  12/09/2009    laparoscopic takedown and mobilization of foregut  . Hiatal hernia repair  12/09/2009  . Nissen fundoplication      x 3  . Laparoscopic lysis of adhesions  01/27/2011    resection of scar tissue; wound exploration  . Total abdominal hysterectomy w/ bilateral salpingoophorectomy  07/30/2010  . Other surgical history  2004 - 2010    vocal cord surgery x 6  . Mass excision Left 12/20/2012    Procedure: EXCISION MASS LEFT AXILLA;  Surgeon: Merrie Roof, MD;  Location: [place];  Service: General;  Laterality: Left;   Social History:  reports that she has been smoking.  She  has never used smokeless tobacco. She reports that she drinks alcohol. She reports that she does not use illicit drugs.  Allergies  Allergen Reactions  . Adhesive [Tape] Other (See Comments)    BLISTERS  . Hydrocodone Other (See Comments)    SEVERE HEADACHE  . Morphine And Related Other (See Comments)    SEVERE HEADACHE  . Aspirin Palpitations  . Penicillins Palpitations    Family History  Problem Relation Age of Onset  . Thyroid cancer Sister   . Colon cancer Maternal Grandfather   . Cancer Maternal Grandfather 58    colon   . Anesthesia problems Mother     "hard time coming out"  . Hypertension Mother     . Hyperlipidemia Mother   . Alcohol abuse Father      Prior to Admission medications   Medication Sig Start Date End Date Taking? Authorizing Provider  albuterol (PROVENTIL HFA;VENTOLIN HFA) 108 (90 BASE) MCG/ACT inhaler 2 inhaltions tid prn wheezing or SOB 12/27/13   Lanice Shirts, MD  amitriptyline (ELAVIL) 50 MG tablet Take 50 mg by mouth at bedtime.    Historical Provider, MD  butalbital-acetaminophen-caffeine (FIORICET, ESGIC) 50-325-40 MG per tablet Take 1 tablet by mouth 2 (two) times daily as needed for headache.    Historical Provider, MD  calcium carbonate (OS-CAL) 600 MG TABS Take 600 mg by mouth 2 (two) times daily with a meal.    Historical Provider, MD  cholecalciferol (VITAMIN D) 1000 UNITS tablet Take 1,000 Units by mouth daily.    Historical Provider, MD  conjugated estrogens (PREMARIN) vaginal cream Place vaginally daily. 3 x/week    Historical Provider, MD  fish oil-omega-3 fatty acids 1000 MG capsule Take 2 g by mouth daily.    Historical Provider, MD  hydrOXYzine (ATARAX/VISTARIL) 10 MG tablet Take one tablet bid prn itching 07/01/14   Lanice Shirts, MD  levothyroxine (SYNTHROID, LEVOTHROID) 175 MCG tablet Take 175 mcg by mouth daily. 175 mcg daily, except on Sundays and then take 175 mcg +1/2 tablet.    Historical Provider, MD  LORazepam (ATIVAN) 1 MG tablet Take 1/2 tablet hs prn sleep. May repeat in 20 mins one time only 07/01/14   Lanice Shirts, MD  mometasone-formoterol (DULERA) 100-5 MCG/ACT AERO Inhale 2 puffs into the lungs 2 (two) times daily. 03/04/14   Lanice Shirts, MD  Multiple Vitamin (MULTIVITAMIN) tablet Take 1 tablet by mouth daily.    Historical Provider, MD  polycarbophil (FIBERCON) 625 MG tablet Take 625 mg by mouth daily.    Historical Provider, MD  predniSONE (DELTASONE) 20 MG tablet Take 3 tablets daily for 3 days then 2 tablets for 3 days then 1 tablet for 3 days then stop 07/01/14   Lanice Shirts, MD  promethazine  (PHENERGAN) 25 MG tablet Take 25 mg by mouth as needed.     Historical Provider, MD  traMADol (ULTRAM) 50 MG tablet Take 1-2 tablets (50-100 mg total) by mouth every 6 (six) hours as needed for pain. 01/10/13   Pedro Earls, MD  triamterene-hydrochlorothiazide (MAXZIDE) 75-50 MG per tablet Take 1 tablet by mouth daily.    Historical Provider, MD  vitamin C (ASCORBIC ACID) 500 MG tablet Take 500 mg by mouth daily.    Historical Provider, MD   Physical Exam: Filed Vitals:   07/08/14 1708 07/08/14 1724 07/08/14 1725 07/08/14 1900  BP: 100/69  100/69 117/64  Pulse: 113 110 111 101  Temp:   98.1 F (36.7 C) 98.2  F (36.8 C)  TempSrc:   Oral Oral  Resp:   22 20  Height:    5\' 5"  (1.651 m)  Weight:    59.875 kg (132 lb)  SpO2: 97% 96% 96% 96%    Wt Readings from Last 3 Encounters:  07/08/14 59.875 kg (132 lb)  07/08/14 63.504 kg (140 lb)  07/01/14 63.05 kg (139 lb)    General:  Appears calm and comfortable Eyes: PERRL, normal lids, irises & conjunctiva ENT: grossly normal hearing, lips & tongue Neck: no LAD, masses or thyromegaly Cardiovascular: RRR, no m/r/g. No LE edema. Respiratory: She has a few scattered ronchi Normal respiratory effort. Abdomen: soft, ntnd Skin: no rash or induration seen on limited exam Musculoskeletal: grossly normal tone BUE/BLE Psychiatric: grossly normal mood and affect, speech fluent and appropriate Neurologic: grossly non-focal.          Labs on Admission:  Basic Metabolic Panel:  Recent Labs Lab 07/08/14 1335  NA 138  K 4.3  CL 99  CO2 25  GLUCOSE 142*  BUN 24*  CREATININE 0.90  CALCIUM 9.5   Liver Function Tests: No results for input(s): AST, ALT, ALKPHOS, BILITOT, PROT, ALBUMIN in the last 168 hours. No results for input(s): LIPASE, AMYLASE in the last 168 hours. No results for input(s): AMMONIA in the last 168 hours. CBC:  Recent Labs Lab 07/08/14 1335  WBC 20.2*  NEUTROABS 17.5*  HGB 14.1  HCT 41.3  MCV 93.4  PLT 352     Cardiac Enzymes: No results for input(s): CKTOTAL, CKMB, CKMBINDEX, TROPONINI in the last 168 hours.  BNP (last 3 results) No results for input(s): PROBNP in the last 8760 hours. CBG: No results for input(s): GLUCAP in the last 168 hours.  Radiological Exams on Admission: Dg Chest 2 View  07/08/2014   CLINICAL DATA:  Cough, congestion  EXAM: CHEST  2 VIEW  COMPARISON:  01/15/2014  FINDINGS: The heart size and mediastinal contours are within normal limits. Both lungs are clear. The visualized skeletal structures are unremarkable.  IMPRESSION: No active cardiopulmonary disease.   Electronically Signed   By: Kathreen Devoid   On: 07/08/2014 12:10      Assessment/Plan Active Problems:   GERD (gastroesophageal reflux disease)   Hypothyroidism   Status asthmaticus   1. Status asthmaticus -will start on nebulizers -continue with dulera -will start on rocephin -started on solumedrol -she also has increased anxiety will give ativan as needed at bedtime  2. Hypothyroidism -will check TSH -continue with home medications  3. GERD -Continue with PPIs -may be contributing to uncontrolled asthma symptoms  4. Ongoing Cigarette smoking -She states that she is a social smoker. -explained to her that she needs to stop smoking due to her asthma symptoms -may need PFT to further evaluate   Code Status: Full Code (must indicate code status--if unknown or must be presumed, indicate so) DVT Prophylaxis:Heparin Family Communication: None (indicate person spoken with, if applicable, with phone number if by telephone) Disposition Plan: Home (indicate anticipated LOS)  Time spent: 57min  Yadhira Mckneely A [facility] Pager [phone]

## 2014-07-08 NOTE — ED Notes (Signed)
Continuous neb being administered.

## 2014-07-08 NOTE — ED Notes (Signed)
EDP speaking with pt.  Pt ambulatory in hallway with EMT.  SAO2 decreased to 92%.

## 2014-07-08 NOTE — ED Notes (Signed)
Report called to [facility] °

## 2014-07-08 NOTE — ED Provider Notes (Signed)
CSN: [id number]     Arrival date & time 07/08/14  1257 History   First MD Initiated Contact with Patient 07/08/14 1306     Chief Complaint  Patient presents with  . URI      Patient is a 58 y.o. female presenting with URI. The history is provided by the patient.  URI Presenting symptoms: congestion, cough and fatigue   Severity:  Severe Onset quality:  Gradual Duration:  5 days Timing:  Constant Progression:  Worsening Chronicity:  New Relieved by:  Nothing Worsened by:  Nothing tried Associated symptoms: myalgias and wheezing   Associated symptoms comment:  Chest pain with coughing  Wheezing:    Severity:  Severe   Onset quality:  Gradual   Duration:  5 days   Timing:  Constant   Progression:  Worsening   Chronicity:  New Patient presents from PCP for further evaluation of asthma/bronchitis Pt reports cough/wheezing for past 5 days and it is worsening She reports CP with cough She feels fatigued She reports greenish productive sputum No hemoptysis  PMH - asthma Soc hx - smoker  Past Medical History  Diagnosis Date  . Headache(784.0)     migraines  . Seasonal allergies   . Personal history of asthma     "allergic asthma"  . History of hiatal hernia     repaired 2011  . GERD (gastroesophageal reflux disease)     occasionally; no current med.  . Hypothyroidism     s/p thyroidectomy  . Chronic abdominal pain     left side - states is scar tissue from previous surgeries  . Difficulty swallowing     food and pills  . 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 Complication of anesthesia     small airway; anesthesia awareness; states remembers things that occur during surgery  . Mass of axilla 11/2012    left  . Cancer     thyroid   Past Surgical History  Procedure Laterality Date  . Appendectomy    . Thyroidectomy    . Splenectomy, total    . Lipoma excision Left 10/19/2001    groin  . Esophagogastric fundoplasty   12/09/2009    laparoscopic takedown and mobilization of foregut  . Hiatal hernia repair  12/09/2009  . Nissen fundoplication      x 3  . Laparoscopic lysis of adhesions  01/27/2011    resection of scar tissue; wound exploration  . Total abdominal hysterectomy w/ bilateral salpingoophorectomy  07/30/2010  . Other surgical history  2004 - 2010    vocal cord surgery x 6  . Mass excision Left 12/20/2012    Procedure: EXCISION MASS LEFT AXILLA;  Surgeon: Merrie Roof, MD;  Location: [place];  Service: General;  Laterality: Left;   Family History  Problem Relation Age of Onset  . Thyroid cancer Sister   . Colon cancer Maternal Grandfather   . Cancer Maternal Grandfather 74    colon   . Anesthesia problems Mother     "hard time coming out"  . Hypertension Mother   . Hyperlipidemia Mother   . Alcohol abuse Father    History  Substance Use Topics  . Smoking status: Current Some Day Smoker  . Smokeless tobacco: Never Used     Comment: quit smoking 2010  . Alcohol Use: 0.0 oz/week     Comment: occasionally   OB History    Gravida Para  Term Preterm AB TAB SAB Ectopic Multiple Living   1         1     Review of Systems  Constitutional: Positive for fatigue.  HENT: Positive for congestion.   Respiratory: Positive for cough and wheezing.   Cardiovascular: Negative for leg swelling.       Chest pain with cough   Gastrointestinal: Negative for vomiting.  Musculoskeletal: Positive for myalgias.  All other systems reviewed and are negative.     Allergies  Adhesive; Hydrocodone; Morphine and related; Aspirin; and Penicillins  Home Medications   Prior to Admission medications   Medication Sig Start Date End Date Taking? Authorizing Provider  albuterol (PROVENTIL HFA;VENTOLIN HFA) 108 (90 BASE) MCG/ACT inhaler 2 inhaltions tid prn wheezing or SOB 12/27/13   Lanice Shirts, MD  amitriptyline (ELAVIL) 50 MG tablet Take 50 mg by mouth at bedtime.    Historical  Provider, MD  butalbital-acetaminophen-caffeine (FIORICET, ESGIC) 50-325-40 MG per tablet Take 1 tablet by mouth 2 (two) times daily as needed for headache.    Historical Provider, MD  calcium carbonate (OS-CAL) 600 MG TABS Take 600 mg by mouth 2 (two) times daily with a meal.    Historical Provider, MD  cholecalciferol (VITAMIN D) 1000 UNITS tablet Take 1,000 Units by mouth daily.    Historical Provider, MD  conjugated estrogens (PREMARIN) vaginal cream Place vaginally daily. 3 x/week    Historical Provider, MD  fish oil-omega-3 fatty acids 1000 MG capsule Take 2 g by mouth daily.    Historical Provider, MD  hydrOXYzine (ATARAX/VISTARIL) 10 MG tablet Take one tablet bid prn itching 07/01/14   Lanice Shirts, MD  levothyroxine (SYNTHROID, LEVOTHROID) 175 MCG tablet Take 175 mcg by mouth daily. 175 mcg daily, except on Sundays and then take 175 mcg +1/2 tablet.    Historical Provider, MD  LORazepam (ATIVAN) 1 MG tablet Take 1/2 tablet hs prn sleep. May repeat in 20 mins one time only 07/01/14   Lanice Shirts, MD  mometasone-formoterol (DULERA) 100-5 MCG/ACT AERO Inhale 2 puffs into the lungs 2 (two) times daily. 03/04/14   Lanice Shirts, MD  Multiple Vitamin (MULTIVITAMIN) tablet Take 1 tablet by mouth daily.    Historical Provider, MD  polycarbophil (FIBERCON) 625 MG tablet Take 625 mg by mouth daily.    Historical Provider, MD  predniSONE (DELTASONE) 20 MG tablet Take 3 tablets daily for 3 days then 2 tablets for 3 days then 1 tablet for 3 days then stop 07/01/14   Lanice Shirts, MD  promethazine (PHENERGAN) 25 MG tablet Take 25 mg by mouth as needed.     Historical Provider, MD  traMADol (ULTRAM) 50 MG tablet Take 1-2 tablets (50-100 mg total) by mouth every 6 (six) hours as needed for pain. 01/10/13   Pedro Earls, MD  triamterene-hydrochlorothiazide (MAXZIDE) 75-50 MG per tablet Take 1 tablet by mouth daily.    Historical Provider, MD  vitamin C (ASCORBIC ACID) 500 MG  tablet Take 500 mg by mouth daily.    Historical Provider, MD   BP 115/73 mmHg  Pulse 101  Temp(Src) 98.6 F (37 C) (Oral)  Resp 22  Wt 140 lb (63.504 kg)  SpO2 99% Physical Exam CONSTITUTIONAL: Well developed/well nourished HEAD: Normocephalic/atraumatic EYES: EOMI/PERRL ENMT: Mucous membranes moist NECK: supple no meningeal signs SPINE/BACK:entire spine nontender CV: S1/S2 noted, no murmurs/rubs/gallops noted LUNGS: tachypnea noted, coarse wheezing noted bilaterally ABDOMEN: soft, nontender, no rebound or guarding, bowel sounds noted throughout abdomen  GU:no cva tenderness NEURO: Pt is awake/alert/appropriate, moves all extremitiesx4.  No facial droop.   EXTREMITIES: pulses normal/equal, full ROM, no LE edema noted SKIN: warm, color normal PSYCH: no abnormalities of mood noted, alert and oriented to situation  ED Course  Procedures  CRITICAL CARE Performed by: Sharyon Cable Total critical care time: 32 Critical care time was exclusive of separately billable procedures and treating other patients. Critical care was necessary to treat or prevent imminent or life-threatening deterioration. Critical care was time spent personally by me on the following activities: development of treatment plan with patient and/or surrogate as well as nursing, discussions with consultants, evaluation of patient's response to treatment, examination of patient, obtaining history from patient or surrogate, ordering and performing treatments and interventions, ordering and review of laboratory studies, ordering and review of radiographic studies, pulse oximetry and re-evaluation of patient's condition. PATIENT REQUIRED MULTIPLE ALBUTEROL NEBULIZER TREATMENTS.  SHE IS NOT IMPROVED AND WILL REQUIRE ADMISSION FOR STATUS ASTHMATICUS Labs Review Labs Reviewed  BASIC METABOLIC PANEL - Abnormal; Notable for the following:    Glucose, Bld 142 (*)    BUN 24 (*)    GFR calc non Af Amer 70 (*)    GFR calc  Af Amer 81 (*)    All other components within normal limits  CBC WITH DIFFERENTIAL - Abnormal; Notable for the following:    WBC 20.2 (*)    Neutrophils Relative % 87 (*)    Neutro Abs 17.5 (*)    Lymphocytes Relative 7 (*)    Monocytes Absolute 1.2 (*)    All other components within normal limits    Imaging Review Dg Chest 2 View  07/08/2014   CLINICAL DATA:  Cough, congestion  EXAM: CHEST  2 VIEW  COMPARISON:  01/15/2014  FINDINGS: The heart size and mediastinal contours are within normal limits. Both lungs are clear. The visualized skeletal structures are unremarkable.  IMPRESSION: No active cardiopulmonary disease.   Electronically Signed   By: Kathreen Devoid   On: 07/08/2014 12:10     EKG Interpretation   Date/Time:  Monday July 08 2014 13:53:51 EST Ventricular Rate:  106 PR Interval:  144 QRS Duration: 92 QT Interval:  358 QTC Calculation: 475 R Axis:   32 Text Interpretation:  Sinus tachycardia Otherwise normal ECG No  significant change since last tracing Confirmed by Christy Gentles  MD, Keyston Ardolino  [phone]) on 07/08/2014 2:05:22 PM     Medications  levofloxacin (LEVAQUIN) IVPB 750 mg (750 mg Intravenous New Bag/Given 07/08/14 1600)  magnesium sulfate IVPB 2 g 50 mL (not administered)  methylPREDNISolone sodium succinate (SOLU-MEDROL) 125 mg/2 mL injection 125 mg (125 mg Intravenous Given 07/08/14 1354)  HYDROmorphone (DILAUDID) injection 1 mg (1 mg Intravenous Given 07/08/14 1349)  ipratropium-albuterol (DUONEB) 0.5-2.5 (3) MG/3ML nebulizer solution 3 mL (3 mLs Nebulization Given 07/08/14 1327)  albuterol (PROVENTIL) (2.5 MG/3ML) 0.083% nebulizer solution 2.5 mg (2.5 mg Nebulization Given 07/08/14 1327)  albuterol (PROVENTIL,VENTOLIN) solution continuous neb (10 mg/hr Nebulization Given 07/08/14 1434)  albuterol (PROVENTIL) (2.5 MG/3ML) 0.083% nebulizer solution 5 mg (5 mg Nebulization Given 07/08/14 1600)  HYDROmorphone (DILAUDID) injection 0.5 mg (0.5 mg Intravenous Given  07/08/14 1559)    MDM  3:08 PM Pt sent from PCP for continued treatment of asthma She has coarse wheezing and required hour long neb.   She has difficulty expectorating due to previous neck surgeries.   3:48 PM Pt has received 15mg  albuterol and 0.5mg  atrovent as well as solumedrol Pt is still tachypneic/wheezing  On ambulation she becomes hypoxia (91%) and begins to cough/wheeze Will need admission 4:09 PM D/w dr Dyann Kief will admit to med-surg bed at [facility] long He requests IV magnesium Final diagnoses:  Status asthmaticus, unspecified asthma severity    Nursing notes including past medical history and social history reviewed and considered in documentation xrays/imaging reviewed by myself and considered during evaluation Labs/vital reviewed myself and considered during evaluation Previous records reviewed and considered     Sharyon Cable, MD 07/08/14 1610

## 2014-07-08 NOTE — ED Notes (Signed)
[facility] at bedside for transport. 

## 2014-07-08 NOTE — ED Notes (Signed)
Pt sent here from PMD office for eval. URi symptoms x 1 week Chest xray done

## 2014-07-08 NOTE — Telephone Encounter (Signed)
Spoke with pt.      She is concerned about cost of going to ER or urgent care.    Will get stat CXR and see her here in office

## 2014-07-08 NOTE — Patient Instructions (Signed)
To ER

## 2014-07-08 NOTE — ED Notes (Signed)
Walked pt in the dept . Pt's SPO2 dropped to 91% and pt got dizzy  while walking. EDP notified.

## 2014-07-08 NOTE — ED Notes (Signed)
Pt continues to cough and fells like she can't catch her breath.  RRT at bedside.

## 2014-07-08 NOTE — Telephone Encounter (Signed)
I called pt about her complaint. Pt states she thinks she has URI which she has not been seen for by Dr. Coralyn Mark. Pt would like to be seen by Dr. Coralyn Mark and refuses to go to urgent care or emergency room due to co-pay being $400. Pt was persistent on Dr. Coralyn Mark calling her. I adv pt that I will send Dr. Coralyn Mark a message to have her call.

## 2014-07-08 NOTE — Telephone Encounter (Signed)
Sarah Huber [phone] (H) [phone] (M) [phone] (W)  Ros[place] called with trouble breathing, I told her she needed to go straight to the Ed, she insisted on seeing Dr Coralyn Mark, I asked Dr Coralyn Mark and she advised for patient to go straight to Ed, she continued to insisted on seeing Dr Coralyn Mark first, after telling Dr Coralyn Mark this she told me she would have Mandy to call her.

## 2014-07-08 NOTE — Progress Notes (Signed)
Subjective:    Patient ID: Sarah Huber, female    DOB: 03-07-1956, 57 y.o.   MRN: [id number]  HPI Sarah Huber is here for acute visit. SOB with chest pain when coughing.  Very difficult expectorating as pt has lots of scar tissue in her neck.  No feve  Facial allergic reaction much improved.    Review of Systems     Objective:   Physical Exam Allergies  Allergen Reactions  . Adhesive [Tape] Other (See Comments)    BLISTERS  . Hydrocodone Other (See Comments)    SEVERE HEADACHE  . Morphine And Related Other (See Comments)    SEVERE HEADACHE  . Aspirin Palpitations  . Penicillins Palpitations   Past Medical History  Diagnosis Date  . Headache(784.0)     migraines  . Seasonal allergies   . Personal history of asthma     "allergic asthma"  . History of hiatal hernia     repaired 2011  . GERD (gastroesophageal reflux disease)     occasionally; no current med.  . Hypothyroidism     s/p thyroidectomy  . Chronic abdominal pain     left side - states is scar tissue from previous surgeries  . Difficulty swallowing     food and pills  . 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 Complication of anesthesia     small airway; anesthesia awareness; states remembers things that occur during surgery  . Mass of axilla 11/2012    left  . Cancer     thyroid   Past Surgical History  Procedure Laterality Date  . Appendectomy    . Thyroidectomy    . Splenectomy, total    . Lipoma excision Left 10/19/2001    groin  . Esophagogastric fundoplasty  12/09/2009    laparoscopic takedown and mobilization of foregut  . Hiatal hernia repair  12/09/2009  . Nissen fundoplication      x 3  . Laparoscopic lysis of adhesions  01/27/2011    resection of scar tissue; wound exploration  . Total abdominal hysterectomy w/ bilateral salpingoophorectomy  07/30/2010  . Other surgical history  2004 - 2010    vocal cord surgery x 6  . Mass excision Left 12/20/2012    Procedure:  EXCISION MASS LEFT AXILLA;  Surgeon: Merrie Roof, MD;  Location: [place];  Service: General;  Laterality: Left;   History   Social History  . Marital Status: Married    Spouse Name: N/A    Number of Children: N/A  . Years of Education: N/A   Occupational History  . Not on file.   Social History Main Topics  . Smoking status: Current Some Day Smoker  . Smokeless tobacco: Never Used     Comment: quit smoking 2010  . Alcohol Use: 0.0 oz/week     Comment: occasionally  . Drug Use: No  . Sexual Activity: Yes   Other Topics Concern  . Not on file   Social History Narrative   Family History  Problem Relation Age of Onset  . Thyroid cancer Sister   . Colon cancer Maternal Grandfather   . Cancer Maternal Grandfather 32    colon   . Anesthesia problems Mother     "hard time coming out"  . Hypertension Mother   . Hyperlipidemia Mother   . Alcohol abuse Father    Patient Active Problem List   Diagnosis Date Noted  . Allergic  rhinitis 03/04/2014  . 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 S/P thyroidectomy 12/31/2013  . Asthma 12/27/2013  . Fibrocystic breast disease 12/27/2013  .  S/P total hysteretomy and BSO  precancerous cervical changes per pt 12/27/2013  .   Thyroid cancer  Dr. Tod Persia 12/27/2013  . GERD (gastroesophageal reflux disease) 12/27/2013  . Osteopenia 12/27/2013  . Hypothyroidism 12/27/2013  . Status post Nissen fundoplication 36/64/4034  . Breast density 12/27/2013  . Seroma complicating a procedure 12/27/2012  . Lipoma of axilla left 11/17/2012   No current facility-administered medications on file prior to visit.   Current Outpatient Prescriptions on File Prior to Visit  Medication Sig Dispense Refill  . albuterol (PROVENTIL HFA;VENTOLIN HFA) 108 (90 BASE) MCG/ACT inhaler 2 inhaltions tid prn wheezing or SOB 1 Inhaler 0  . amitriptyline (ELAVIL) 50 MG tablet Take 50 mg by mouth at bedtime.    . butalbital-acetaminophen-caffeine  (FIORICET, ESGIC) 50-325-40 MG per tablet Take 1 tablet by mouth 2 (two) times daily as needed for headache.    . calcium carbonate (OS-CAL) 600 MG TABS Take 600 mg by mouth 2 (two) times daily with a meal.    . cholecalciferol (VITAMIN D) 1000 UNITS tablet Take 1,000 Units by mouth daily.    Marland Kitchen conjugated estrogens (PREMARIN) vaginal cream Place vaginally daily. 3 x/week    . fish oil-omega-3 fatty acids 1000 MG capsule Take 2 g by mouth daily.    . hydrOXYzine (ATARAX/VISTARIL) 10 MG tablet Take one tablet bid prn itching 30 tablet 0  . levothyroxine (SYNTHROID, LEVOTHROID) 175 MCG tablet Take 175 mcg by mouth daily. 175 mcg daily, except on Sundays and then take 175 mcg +1/2 tablet.    Marland Kitchen LORazepam (ATIVAN) 1 MG tablet Take 1/2 tablet hs prn sleep. May repeat in 20 mins one time only 10 tablet 0  . mometasone-formoterol (DULERA) 100-5 MCG/ACT AERO Inhale 2 puffs into the lungs 2 (two) times daily. 1 Inhaler 2  . Multiple Vitamin (MULTIVITAMIN) tablet Take 1 tablet by mouth daily.    . polycarbophil (FIBERCON) 625 MG tablet Take 625 mg by mouth daily.    . predniSONE (DELTASONE) 20 MG tablet Take 3 tablets daily for 3 days then 2 tablets for 3 days then 1 tablet for 3 days then stop 18 tablet 0  . promethazine (PHENERGAN) 25 MG tablet Take 25 mg by mouth as needed.     . traMADol (ULTRAM) 50 MG tablet Take 1-2 tablets (50-100 mg total) by mouth every 6 (six) hours as needed for pain. 30 tablet 1  . triamterene-hydrochlorothiazide (MAXZIDE) 75-50 MG per tablet Take 1 tablet by mouth daily.    . vitamin C (ASCORBIC ACID) 500 MG tablet Take 500 mg by mouth daily.             Assessment & Plan:  Acute asthmatic bronchitis in setting of lots of neck scarring :   CXR neg   I have just treated pt with large dose of oral steroids for her allergic reaction.  I feel she needs Iv treatment with serial HHN  willl transport to ER.   I spoke with Dr. Christy Gentles  Who will evaluate pt.

## 2014-07-08 NOTE — ED Notes (Signed)
Report called to unit RN. °

## 2014-07-08 NOTE — ED Notes (Signed)
Chest pain with coughing.

## 2014-07-08 NOTE — Progress Notes (Signed)
Received called from [facility] by Dr. Christy Gentles requesting admission for Sarah Huber, 58 y/o female with hx of Asthma, GERD, hypothyroidism and seasonal allergies. Patient presented with ongoing SOB, productive cough and general malaise since 07/01/14. Records reports continue worsening pathway and progression of her symptoms, including ongoing wheezing and borderline low normal O2 sat despite multiple nebulizer treatments. CXR w/o infiltrates. No frank fever, but elevated WBC's (which could be due to steroids) and tachypnea (RR 24). Will be at admitted to [facility] [place] bed for further evaluation and treatment.  Plan: -requested 2G magnesium to be given -in agreement with levaquin for acute bronchitis  Barton Dubois [phone]

## 2014-07-08 NOTE — ED Notes (Signed)
Chart reviewed and care assumed. 

## 2014-07-08 NOTE — ED Notes (Signed)
RRt at bedside administering neb tx.

## 2014-07-09 DIAGNOSIS — R739 Hyperglycemia, unspecified: Secondary | ICD-10-CM

## 2014-07-09 LAB — HEMOGLOBIN A1C
HEMOGLOBIN A1C: 5.9 % — AB (ref <5.7)
MEAN PLASMA GLUCOSE: 123 mg/dL — AB (ref <117)

## 2014-07-09 LAB — GLUCOSE, CAPILLARY
GLUCOSE-CAPILLARY: 206 mg/dL — AB (ref 70–99)
GLUCOSE-CAPILLARY: 122 mg/dL — AB (ref 70–99)
Glucose-Capillary: 149 mg/dL — ABNORMAL HIGH (ref 70–99)
Glucose-Capillary: 142 mg/dL — ABNORMAL HIGH (ref 70–99)

## 2014-07-09 LAB — CBC
HCT: 36.7 % (ref 36.0–46.0)
Hemoglobin: 12.3 g/dL (ref 12.0–15.0)
MCH: 31.9 pg (ref 26.0–34.0)
MCHC: 33.5 g/dL (ref 30.0–36.0)
MCV: 95.3 fL (ref 78.0–100.0)
PLATELETS: 329 10*3/uL (ref 150–400)
RBC: 3.85 MIL/uL — AB (ref 3.87–5.11)
RDW: 13.4 % (ref 11.5–15.5)
WBC: 21 10*3/uL — AB (ref 4.0–10.5)

## 2014-07-09 LAB — COMPREHENSIVE METABOLIC PANEL
ALT: 14 U/L (ref 0–35)
AST: 12 U/L (ref 0–37)
Albumin: 3.1 g/dL — ABNORMAL LOW (ref 3.5–5.2)
Alkaline Phosphatase: 53 U/L (ref 39–117)
Anion gap: 16 — ABNORMAL HIGH (ref 5–15)
BILIRUBIN TOTAL: 0.3 mg/dL (ref 0.3–1.2)
BUN: 18 mg/dL (ref 6–23)
CALCIUM: 8.8 mg/dL (ref 8.4–10.5)
CHLORIDE: 97 meq/L (ref 96–112)
CO2: 25 meq/L (ref 19–32)
Creatinine, Ser: 0.77 mg/dL (ref 0.50–1.10)
GFR calc Af Amer: >90 mL/min (ref >90)
Glucose, Bld: 152 mg/dL — ABNORMAL HIGH (ref 70–99)
POTASSIUM: 3.8 meq/L (ref 3.7–5.3)
SODIUM: 138 meq/L (ref 137–147)
Total Protein: 6.3 g/dL (ref 6.0–8.3)

## 2014-07-09 LAB — TSH: TSH: 0.012 u[IU]/mL — ABNORMAL LOW (ref 0.350–4.500)

## 2014-07-09 LAB — T4, FREE: Free T4: 1.68 ng/dL (ref 0.80–1.80)

## 2014-07-09 MED ORDER — METHYLPREDNISOLONE SODIUM SUCC 125 MG IJ SOLR
60.0000 mg | Freq: Three times a day (TID) | INTRAMUSCULAR | Status: DC
  Administered 2014-07-09 – 2014-07-11 (×5): 60 mg via INTRAVENOUS
  Filled 2014-07-09 (×8): qty 0.96

## 2014-07-09 MED ORDER — METHOCARBAMOL 500 MG PO TABS
500.0000 mg | ORAL_TABLET | Freq: Four times a day (QID) | ORAL | Status: DC | PRN
  Administered 2014-07-09 – 2014-07-12 (×8): 500 mg via ORAL
  Filled 2014-07-09 (×8): qty 1

## 2014-07-09 MED ORDER — MAGNESIUM SULFATE 2 GM/50ML IV SOLN
2.0000 g | Freq: Once | INTRAVENOUS | Status: AC
  Administered 2014-07-09: 2 g via INTRAVENOUS
  Filled 2014-07-09: qty 50

## 2014-07-09 MED ORDER — BUDESONIDE 0.25 MG/2ML IN SUSP
0.2500 mg | Freq: Two times a day (BID) | RESPIRATORY_TRACT | Status: DC
  Administered 2014-07-09 – 2014-07-11 (×6): 0.25 mg via RESPIRATORY_TRACT
  Filled 2014-07-09 (×7): qty 2

## 2014-07-09 MED ORDER — INSULIN ASPART 100 UNIT/ML ~~LOC~~ SOLN
0.0000 [IU] | Freq: Three times a day (TID) | SUBCUTANEOUS | Status: DC
Start: 2014-07-09 — End: 2014-07-12
  Administered 2014-07-09: 3 [IU] via SUBCUTANEOUS
  Administered 2014-07-09 – 2014-07-11 (×6): 1 [IU] via SUBCUTANEOUS

## 2014-07-09 MED ORDER — ALBUTEROL SULFATE (2.5 MG/3ML) 0.083% IN NEBU
2.5000 mg | INHALATION_SOLUTION | Freq: Four times a day (QID) | RESPIRATORY_TRACT | Status: DC
Start: 2014-07-09 — End: 2014-07-09
  Administered 2014-07-09 (×3): 2.5 mg via RESPIRATORY_TRACT
  Filled 2014-07-09 (×3): qty 3

## 2014-07-09 MED ORDER — BENZONATATE 100 MG PO CAPS
200.0000 mg | ORAL_CAPSULE | Freq: Three times a day (TID) | ORAL | Status: DC | PRN
  Administered 2014-07-09 – 2014-07-12 (×8): 200 mg via ORAL
  Filled 2014-07-09 (×8): qty 2

## 2014-07-09 MED ORDER — ALBUTEROL SULFATE (2.5 MG/3ML) 0.083% IN NEBU
2.5000 mg | INHALATION_SOLUTION | Freq: Four times a day (QID) | RESPIRATORY_TRACT | Status: DC
  Administered 2014-07-10 – 2014-07-12 (×8): 2.5 mg via RESPIRATORY_TRACT
  Filled 2014-07-09 (×9): qty 3

## 2014-07-09 MED ORDER — PANTOPRAZOLE SODIUM 40 MG PO TBEC
40.0000 mg | DELAYED_RELEASE_TABLET | Freq: Every day | ORAL | Status: DC
  Administered 2014-07-09 – 2014-07-12 (×4): 40 mg via ORAL
  Filled 2014-07-09 (×6): qty 1

## 2014-07-09 MED ORDER — IPRATROPIUM-ALBUTEROL 0.5-2.5 (3) MG/3ML IN SOLN
3.0000 mL | Freq: Four times a day (QID) | RESPIRATORY_TRACT | Status: DC | PRN
  Administered 2014-07-11: 3 mL via RESPIRATORY_TRACT
  Filled 2014-07-09: qty 3

## 2014-07-09 MED FILL — Ondansetron HCl Inj 4 MG/2ML (2 MG/ML): INTRAMUSCULAR | Qty: 2 | Status: AC

## 2014-07-09 NOTE — Progress Notes (Signed)
INITIAL NUTRITION ASSESSMENT  DOCUMENTATION CODES Per approved criteria  -Not Applicable   INTERVENTION: -Recommend snacks BID per pt preference -Encouraged intake of heart healthy balanced snacks -RD to continue to monitor  NUTRITION DIAGNOSIS: Inadequate oral intake related to early satiety/nausea as evidenced by PO intake <75% for one week.   Goal: Pt to meet >/= 90% of their estimated nutrition needs    Monitor:  Total protein/energy intake, labs, weights, GI profile  Reason for Assessment: MST  58 y.o. female  Admitting Dx: <principal problem not specified>  ASSESSMENT: Sarah Huber is a 58 y.o. female who presents with status asthmaticus. Patient states that since last Tuesday she has noted increased difficulty breathing. She states that she has had a cough and also has felt feverish  -Pt reported suffering from chronic nausea that has been inhibiting PO intake for prolonged period; unable to determine when decreased appetite began. -Has hx of hiatal hernia, and noted previous surgeries have left large amount of scar tissue that have also inhibited PO intake -Tolerates 4 small meals daily, and snacks on fruit, vegetables, cheese and crackers, yogurt etc. Has eliminated sweets/high fat foods from diet to assist in preventing delayed gastric emptying -Current PO intake 60%. Will order snacks BID to break up meal intake -Has experienced ongoing weight loss. Previous medical records indicate 13 lb wt loss in 4 month (8% body weight loss, non-significant for time frame).    Height: Ht Readings from Last 1 Encounters:  07/08/14 5\' 5"  (1.651 m)    Weight: Wt Readings from Last 1 Encounters:  07/08/14 132 lb (59.875 kg)    Ideal Body Weight: 125 lb  % Ideal Body Weight: 106%  Wt Readings from Last 10 Encounters:  07/08/14 132 lb (59.875 kg)  07/08/14 140 lb (63.504 kg)  07/01/14 139 lb (63.05 kg)  03/04/14 145 lb (65.772 kg)  01/22/14 147 lb (66.679 kg)  01/16/14  149 lb (67.586 kg)  01/15/14 140 lb (63.504 kg)  01/10/14 143 lb (64.864 kg)  12/27/13 145 lb (65.772 kg)  01/12/13 146 lb 3.2 oz (66.316 kg)    Usual Body Weight: 140-145 lb  % Usual Body Weight: 92-94%  BMI:  Body mass index is 21.97 kg/(m^2).  Estimated Nutritional Needs: Kcal: 1500-1700 Protein: 60-75 gram Fluid: >/= 1500 ml daily  Skin: WDL  Diet Order: Diet Carb Modified  EDUCATION NEEDS: -No education needs identified at this time   Intake/Output Summary (Last 24 hours) at 07/09/14 1539 Last data filed at 07/09/14 1230  Gross per 24 hour  Intake    630 ml  Output      0 ml  Net    630 ml    Last BM: PTA   Labs:   Recent Labs Lab 07/08/14 1335 07/08/14 2030 07/09/14 0431  NA 138  --  138  K 4.3  --  3.8  CL 99  --  97  CO2 25  --  25  BUN 24*  --  18  CREATININE 0.90 0.84 0.77  CALCIUM 9.5  --  8.8  GLUCOSE 142*  --  152*    CBG (last 3)   Recent Labs  07/09/14 0803 07/09/14 1155  GLUCAP 206* 122*    Scheduled Meds: . albuterol  2.5 mg Nebulization Q6H WA  . amitriptyline  50 mg Oral QHS  . budesonide (PULMICORT) nebulizer solution  0.25 mg Nebulization BID  . calcium carbonate  1 tablet Oral BID WC  . cholecalciferol  1,000 Units  Oral Daily  . conjugated estrogens  1 Applicatorful Vaginal Once per day on Tue Thu Sat  . docusate sodium  100 mg Oral BID  . folic acid  1 mg Oral Daily  . heparin  5,000 Units Subcutaneous 3 times per day  . insulin aspart  0-9 Units Subcutaneous TID WC  . levofloxacin (LEVAQUIN) IV  750 mg Intravenous Q24H  . levothyroxine  175 mcg Oral QAC breakfast  . methylPREDNISolone (SOLU-MEDROL) injection  40 mg Intravenous Q6H  . multivitamin with minerals  1 tablet Oral Daily  . omega-3 acid ethyl esters  1 g Oral BID  . pantoprazole  40 mg Oral Daily  . polycarbophil  625 mg Oral Daily  . thiamine  100 mg Oral Daily  . triamterene-hydrochlorothiazide  1 tablet Oral Daily  . vitamin C  500 mg Oral Daily     Continuous Infusions: . sodium chloride 50 mL/hr at 07/08/14 2138    Past Medical History  Diagnosis Date  . Headache(784.0)     migraines  . Seasonal allergies   . Personal history of asthma     "allergic asthma"  . History of hiatal hernia     repaired 2011  . GERD (gastroesophageal reflux disease)     occasionally; no current med.  . Hypothyroidism     s/p thyroidectomy  . Chronic abdominal pain     left side - states is scar tissue from previous surgeries  . Difficulty swallowing     food and pills  . 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 Complication of anesthesia     small airway; anesthesia awareness; states remembers things that occur during surgery  . Mass of axilla 11/2012    left  . Cancer     thyroid    Past Surgical History  Procedure Laterality Date  . Appendectomy    . Thyroidectomy    . Splenectomy, total    . Lipoma excision Left 10/19/2001    groin  . Esophagogastric fundoplasty  12/09/2009    laparoscopic takedown and mobilization of foregut  . Hiatal hernia repair  12/09/2009  . Nissen fundoplication      x 3  . Laparoscopic lysis of adhesions  01/27/2011    resection of scar tissue; wound exploration  . Total abdominal hysterectomy w/ bilateral salpingoophorectomy  07/30/2010  . Other surgical history  2004 - 2010    vocal cord surgery x 6  . Mass excision Left 12/20/2012    Procedure: EXCISION MASS LEFT AXILLA;  Surgeon: Merrie Roof, MD;  Location: [facility];  Service: General;  Laterality: Left;    Atlee Abide MS RD LDN Clinical Dietitian [phone]

## 2014-07-09 NOTE — Plan of Care (Signed)
Problem: Phase I Progression Outcomes Goal: Voiding-avoid urinary catheter unless indicated Outcome: Completed/Met Date Met:  07/09/14     

## 2014-07-09 NOTE — Progress Notes (Addendum)
[facility] PROGRESS NOTE  Sarah Huber NTI:[id number] DOB: Nov 03, 1955 DOA: 07/08/2014 PCP: [name], MD  Assessment/Plan: 1-asthma exacerbation and bronchitis -continue solumedrol, PRN/scheduled nebulizer tx -started on pulmicort -will continue mucinex and PRN tessalon for excessive cough -continue levaquin  2-hypothyroidism: acquired and secondary to thyroidectomy from thyroid cancer -continue current synthroid supplementation -free T4 WNL -TSH as expected is suppressed (0.012)  4-GERD: continue PPI  5-anxiety, itching and jittering: continue PRN atarax  6-tobacco abuse: cessation counseling provided  7-hyperglycemia: due to steroids. But patient with A1C of 5.9 -low carb diet -started on SSI while on steroids  Code Status: Full Family Communication: daughter in law at bedside Disposition Plan: remains inpatient    Consultants:  None   Procedures:  See below for x-ray reports   Antibiotics:  levaquin   HPI/Subjective: Still struggling with cough and difficulty breathing. Actively wheezing   Objective: Filed Vitals:   07/09/14 1400  BP: 88/65  Pulse: 122  Temp: 98.4 F (36.9 C)  Resp: 18    Intake/Output Summary (Last 24 hours) at 07/09/14 1710 Last data filed at 07/09/14 1230  Gross per 24 hour  Intake    480 ml  Output      0 ml  Net    480 ml   Filed Weights   07/08/14 1305 07/08/14 1900  Weight: 63.504 kg (140 lb) 59.875 kg (132 lb)    Exam:   General:  Afebrile, still with SOB and audible wheezing w/o stethoscope. Intermittent coughing spells; unable to speak in full sentences   Cardiovascular: mild tachycardia, no rubs or gallops  Respiratory: poor air movement, scattered rhonchi and positive exp wheezing  Abdomen: soft, NT, ND, positive BS, no guarding  Musculoskeletal: no edema or cyanosis   Data Reviewed: Basic Metabolic Panel:  Recent Labs Lab 07/08/14 1335 07/08/14 2030 07/09/14 0431  NA 138  --  138   K 4.3  --  3.8  CL 99  --  97  CO2 25  --  25  GLUCOSE 142*  --  152*  BUN 24*  --  18  CREATININE 0.90 0.84 0.77  CALCIUM 9.5  --  8.8   Liver Function Tests:  Recent Labs Lab 07/09/14 0431  AST 12  ALT 14  ALKPHOS 53  BILITOT 0.3  PROT 6.3  ALBUMIN 3.1*   CBC:  Recent Labs Lab 07/08/14 1335 07/08/14 2030 07/09/14 0431  WBC 20.2* 18.8* 21.0*  NEUTROABS 17.5*  --   --   HGB 14.1 12.6 12.3  HCT 41.3 37.6 36.7  MCV 93.4 94.5 95.3  PLT 352 349 329   CBG:  Recent Labs Lab 07/09/14 0803 07/09/14 1155  GLUCAP 206* 122*    Studies: Dg Chest 2 View  07/08/2014   CLINICAL DATA:  Cough, congestion  EXAM: CHEST  2 VIEW  COMPARISON:  01/15/2014  FINDINGS: The heart size and mediastinal contours are within normal limits. Both lungs are clear. The visualized skeletal structures are unremarkable.  IMPRESSION: No active cardiopulmonary disease.   Electronically Signed   By: Kathreen Devoid   On: 07/08/2014 12:10    Scheduled Meds: . albuterol  2.5 mg Nebulization Q6H WA  . amitriptyline  50 mg Oral QHS  . budesonide (PULMICORT) nebulizer solution  0.25 mg Nebulization BID  . calcium carbonate  1 tablet Oral BID WC  . cholecalciferol  1,000 Units Oral Daily  . conjugated estrogens  1 Applicatorful Vaginal Once per day on Tue Thu Sat  . docusate sodium  100 mg Oral BID  . folic acid  1 mg Oral Daily  . heparin  5,000 Units Subcutaneous 3 times per day  . insulin aspart  0-9 Units Subcutaneous TID WC  . levofloxacin (LEVAQUIN) IV  750 mg Intravenous Q24H  . levothyroxine  175 mcg Oral QAC breakfast  . magnesium sulfate 1 - 4 g bolus IVPB  2 g Intravenous Once  . methylPREDNISolone (SOLU-MEDROL) injection  60 mg Intravenous 3 times per day  . multivitamin with minerals  1 tablet Oral Daily  . omega-3 acid ethyl esters  1 g Oral BID  . pantoprazole  40 mg Oral Daily  . polycarbophil  625 mg Oral Daily  . thiamine  100 mg Oral Daily  . triamterene-hydrochlorothiazide  1  tablet Oral Daily  . vitamin C  500 mg Oral Daily   Continuous Infusions: . sodium chloride 50 mL/hr at 07/08/14 2138    Active Problems:   GERD (gastroesophageal reflux disease)   Hypothyroidism   Status asthmaticus    Time spent: 30 minutes    Barton Dubois  [facility] Pager [phone]. If 7PM-7AM, please contact night-coverage at [email or web address], password [facility] 07/09/2014, 5:10 PM  LOS: 1 day

## 2014-07-10 DIAGNOSIS — J209 Acute bronchitis, unspecified: Principal | ICD-10-CM

## 2014-07-10 LAB — GLUCOSE, CAPILLARY
GLUCOSE-CAPILLARY: 115 mg/dL — AB (ref 70–99)
Glucose-Capillary: 123 mg/dL — ABNORMAL HIGH (ref 70–99)
Glucose-Capillary: 138 mg/dL — ABNORMAL HIGH (ref 70–99)
Glucose-Capillary: 144 mg/dL — ABNORMAL HIGH (ref 70–99)

## 2014-07-10 MED ORDER — HYDROMORPHONE HCL 1 MG/ML IJ SOLN
0.5000 mg | INTRAMUSCULAR | Status: DC | PRN
  Administered 2014-07-10 – 2014-07-12 (×9): 0.5 mg via INTRAVENOUS
  Filled 2014-07-10 (×9): qty 1

## 2014-07-10 MED ORDER — GUAIFENESIN 100 MG/5ML PO SYRP
200.0000 mg | ORAL_SOLUTION | ORAL | Status: DC | PRN
  Administered 2014-07-10 – 2014-07-12 (×6): 200 mg via ORAL
  Filled 2014-07-10 (×8): qty 10

## 2014-07-10 MED ORDER — HYDROMORPHONE HCL 1 MG/ML IJ SOLN
0.5000 mg | Freq: Once | INTRAMUSCULAR | Status: AC
  Administered 2014-07-10: 0.5 mg via INTRAVENOUS
  Filled 2014-07-10: qty 1

## 2014-07-10 NOTE — Care Management Note (Signed)
CARE MANAGEMENT NOTE 07/10/2014  Patient:  Sarah Huber, Sarah Huber   Account Number:  [id number]  Date Initiated:  07/10/2014  Documentation initiated by:  Marney Doctor  Subjective/Objective Assessment:   58 yo admitted with Status Asthmaticus. Hx of thyroid ca     Action/Plan:   From home with spouse.   Anticipated DC Date:  07/12/2014   Anticipated DC Plan:  [place]  CM consult      Choice offered to / List presented to:             Status of service:  In process, will continue to follow Medicare Important Message given?   (If response is "NO", the following Medicare IM given date fields will be blank) Date Medicare IM given:   Medicare IM given by:   Date Additional Medicare IM given:   Additional Medicare IM given by:    Discharge Disposition:    Per UR Regulation:  Reviewed for med. necessity/level of care/duration of stay  If discussed at [place] of Stay Meetings, dates discussed:    Comments:  07/10/14 Marney Doctor RN,BSN,NCM [phone] Chart reviewed and CM following for DC needs.

## 2014-07-10 NOTE — Progress Notes (Signed)
[facility] PROGRESS NOTE  Nashly Olsson FTD:[id number] DOB: 1956/03/26 DOA: 07/08/2014  PCP: [name], MD  Brief HPI: Modesty Rudy is a 58 y.o. female who presented with difficulty breathing. She stated that she had a cough and also felt feverish. She was admitted to the hospital for further management.  Past medical history:  Past Medical History  Diagnosis Date  . Headache(784.0)     migraines  . Seasonal allergies   . Personal history of asthma     "allergic asthma"  . History of hiatal hernia     repaired 2011  . GERD (gastroesophageal reflux disease)     occasionally; no current med.  . Hypothyroidism     s/p thyroidectomy  . Chronic abdominal pain     left side - states is scar tissue from previous surgeries  . Difficulty swallowing     food and pills  . 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 Complication of anesthesia     small airway; anesthesia awareness; states remembers things that occur during surgery  . Mass of axilla 11/2012    left  . Cancer     thyroid    Consultants: None  Procedures: None  Antibiotics: Levaquin 11/16-->  Subjective: Patient is starting to feel better though still has a lot of cough. Some chest discomfort only while coughing. Was wheezing last night.   Objective: Vital Signs  Filed Vitals:   07/09/14 2054 07/10/14 0506 07/10/14 0833 07/10/14 1352  BP: 99/63 106/68  108/62  Pulse: 92 85  83  Temp: 98.2 F (36.8 C) 98 F (36.7 C)  98.3 F (36.8 C)  TempSrc: Oral Oral  Oral  Resp: 18 18  17   Height:      Weight:      SpO2: 96% 99% 97% 97%    Intake/Output Summary (Last 24 hours) at 07/10/14 1403 Last data filed at 07/10/14 1248  Gross per 24 hour  Intake    540 ml  Output      0 ml  Net    540 ml   Filed Weights   07/08/14 1305 07/08/14 1900  Weight: 63.504 kg (140 lb) 59.875 kg (132 lb)    General appearance: alert, cooperative, appears stated age and no  distress Resp: Occ wheezing but decreased air entry. No crackles.  Cardio: regular rate and rhythm, S1, S2 normal, no murmur, click, rub or gallop GI: soft, non-tender; bowel sounds normal; no masses,  no organomegaly Extremities: extremities normal, atraumatic, no cyanosis or edema  Lab Results:  Basic Metabolic Panel:  Recent Labs Lab 07/08/14 1335 07/08/14 2030 07/09/14 0431  NA 138  --  138  K 4.3  --  3.8  CL 99  --  97  CO2 25  --  25  GLUCOSE 142*  --  152*  BUN 24*  --  18  CREATININE 0.90 0.84 0.77  CALCIUM 9.5  --  8.8   Liver Function Tests:  Recent Labs Lab 07/09/14 0431  AST 12  ALT 14  ALKPHOS 53  BILITOT 0.3  PROT 6.3  ALBUMIN 3.1*   CBC:  Recent Labs Lab 07/08/14 1335 07/08/14 2030 07/09/14 0431  WBC 20.2* 18.8* 21.0*  NEUTROABS 17.5*  --   --   HGB 14.1 12.6 12.3  HCT 41.3 37.6 36.7  MCV 93.4 94.5 95.3  PLT 352 349 329   CBG:  Recent Labs Lab 07/09/14 1155 07/09/14 1702 07/09/14  2051 07/10/14 0745 07/10/14 1131  GLUCAP 122* 149* 142* 123* 115*    Studies/Results: No results found.  Medications:  Scheduled: . albuterol  2.5 mg Nebulization Q6H WA  . amitriptyline  50 mg Oral QHS  . budesonide (PULMICORT) nebulizer solution  0.25 mg Nebulization BID  . calcium carbonate  1 tablet Oral BID WC  . cholecalciferol  1,000 Units Oral Daily  . conjugated estrogens  1 Applicatorful Vaginal Once per day on Tue Thu Sat  . docusate sodium  100 mg Oral BID  . folic acid  1 mg Oral Daily  . heparin  5,000 Units Subcutaneous 3 times per day  . insulin aspart  0-9 Units Subcutaneous TID WC  . levofloxacin (LEVAQUIN) IV  750 mg Intravenous Q24H  . levothyroxine  175 mcg Oral QAC breakfast  . methylPREDNISolone (SOLU-MEDROL) injection  60 mg Intravenous 3 times per day  . multivitamin with minerals  1 tablet Oral Daily  . omega-3 acid ethyl esters  1 g Oral BID  . pantoprazole  40 mg Oral Daily  . polycarbophil  625 mg Oral Daily  .  thiamine  100 mg Oral Daily  . triamterene-hydrochlorothiazide  1 tablet Oral Daily  . vitamin C  500 mg Oral Daily   Continuous: . sodium chloride 50 mL/hr at 07/08/14 2138   JOI:[id number] **OR** acetaminophen, alum & mag hydroxide-simeth, benzonatate, bisacodyl, butalbital-acetaminophen-caffeine, guaifenesin, [COMPLETED]  HYDROmorphone (DILAUDID) injection **FOLLOWED BY** HYDROmorphone (DILAUDID) injection, hydrOXYzine, ipratropium-albuterol, LORazepam, methocarbamol, promethazine, traMADol  Assessment/Plan:  Active Problems:   GERD (gastroesophageal reflux disease)   Hypothyroidism   Status asthmaticus    Possible Asthma Exacerbation/Acute Bronchitis Slow to improve. Continue solumedrol, PRN/scheduled nebulizer tx. Continue levaquin. Analgesics for pain related to coughing spells. CXR was unremarkable on admission. Elevated WBC is due to steroids.  Hypothyroidism: acquired and secondary to thyroidectomy from thyroid cancer Continue current synthroid supplementation. Free T4 WNL. TSH as expected is suppressed (0.012)  History of GERD Continue PPI  Recent Allergic Reaction with Facial Rash Improved.   Tobacco abuse Cessation counseling provided  Hyperglycemia Due to steroids. But patient with A1C of 5.9. SSI.   DVT Prophylaxis: Heparin    Code Status: Full Code  Family Communication: Discussed with patient  Disposition Plan: Not ready for discharge.    LOS: 2 days   [facility] Pager [phone] 07/10/2014, 2:03 PM  If 8PM-8AM, please contact night-coverage at [email or web address], password [facility]

## 2014-07-11 ENCOUNTER — Inpatient Hospital Stay (HOSPITAL_COMMUNITY): Payer: 59

## 2014-07-11 DIAGNOSIS — J209 Acute bronchitis, unspecified: Secondary | ICD-10-CM | POA: Diagnosis present

## 2014-07-11 DIAGNOSIS — M791 Myalgia: Secondary | ICD-10-CM

## 2014-07-11 LAB — CBC
HEMATOCRIT: 34.9 % — AB (ref 36.0–46.0)
Hemoglobin: 11.8 g/dL — ABNORMAL LOW (ref 12.0–15.0)
MCH: 32 pg (ref 26.0–34.0)
MCHC: 33.8 g/dL (ref 30.0–36.0)
MCV: 94.6 fL (ref 78.0–100.0)
Platelets: 336 10*3/uL (ref 150–400)
RBC: 3.69 MIL/uL — ABNORMAL LOW (ref 3.87–5.11)
RDW: 13.6 % (ref 11.5–15.5)
WBC: 14.9 10*3/uL — AB (ref 4.0–10.5)

## 2014-07-11 LAB — GLUCOSE, CAPILLARY
GLUCOSE-CAPILLARY: 125 mg/dL — AB (ref 70–99)
Glucose-Capillary: 125 mg/dL — ABNORMAL HIGH (ref 70–99)
Glucose-Capillary: 136 mg/dL — ABNORMAL HIGH (ref 70–99)
Glucose-Capillary: 158 mg/dL — ABNORMAL HIGH (ref 70–99)

## 2014-07-11 LAB — BASIC METABOLIC PANEL
Anion gap: 11 (ref 5–15)
BUN: 23 mg/dL (ref 6–23)
CO2: 27 mEq/L (ref 19–32)
CREATININE: 0.98 mg/dL (ref 0.50–1.10)
Calcium: 9 mg/dL (ref 8.4–10.5)
Chloride: 101 mEq/L (ref 96–112)
GFR calc non Af Amer: 63 mL/min — ABNORMAL LOW (ref >90)
GFR, EST AFRICAN AMERICAN: 73 mL/min — AB (ref >90)
Glucose, Bld: 134 mg/dL — ABNORMAL HIGH (ref 70–99)
Potassium: 4.2 mEq/L (ref 3.7–5.3)
Sodium: 139 mEq/L (ref 137–147)

## 2014-07-11 MED ORDER — PREDNISONE 50 MG PO TABS
60.0000 mg | ORAL_TABLET | Freq: Two times a day (BID) | ORAL | Status: DC
  Administered 2014-07-11 – 2014-07-12 (×3): 60 mg via ORAL
  Filled 2014-07-11 (×5): qty 1

## 2014-07-11 MED ORDER — POLYETHYLENE GLYCOL 3350 17 G PO PACK
17.0000 g | PACK | Freq: Two times a day (BID) | ORAL | Status: DC
  Administered 2014-07-11 – 2014-07-12 (×2): 17 g via ORAL
  Filled 2014-07-11 (×5): qty 1

## 2014-07-11 MED ORDER — LEVOFLOXACIN 500 MG PO TABS
500.0000 mg | ORAL_TABLET | Freq: Every day | ORAL | Status: DC
  Administered 2014-07-11 – 2014-07-12 (×2): 500 mg via ORAL
  Filled 2014-07-11 (×2): qty 1

## 2014-07-11 MED ORDER — OXYCODONE HCL 5 MG PO TABS
5.0000 mg | ORAL_TABLET | ORAL | Status: DC | PRN
  Administered 2014-07-11 – 2014-07-12 (×5): 5 mg via ORAL
  Filled 2014-07-11 (×6): qty 1

## 2014-07-11 NOTE — Progress Notes (Signed)
[facility] PROGRESS NOTE  Deema Juncaj [id number]:[id number] DOB: 07-Aug-1956 DOA: 07/08/2014  PCP: [name], MD  Brief HPI: Sarah Huber is a 58 y.o. female who presented with difficulty breathing. She stated that she had a cough and also felt feverish. She was admitted to the hospital for further management.  Past medical history:  Past Medical History  Diagnosis Date  . Headache(784.0)     migraines  . Seasonal allergies   . Personal history of asthma     "allergic asthma"  . History of hiatal hernia     repaired 2011  . GERD (gastroesophageal reflux disease)     occasionally; no current med.  . Hypothyroidism     s/p thyroidectomy  . Chronic abdominal pain     left side - states is scar tissue from previous surgeries  . Difficulty swallowing     food and pills  . 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 Complication of anesthesia     small airway; anesthesia awareness; states remembers things that occur during surgery  . Mass of axilla 11/2012    left  . Cancer     thyroid    Consultants: None  Procedures: None  Antibiotics: Levaquin 11/16-->  Subjective: Patient feels her cough and breathing is better. But she has left sided abdominal pain especially with coughing. She feels as if she pulled a muscle. She denies nausea/vomiting. Denies diarrhea.   Objective: Vital Signs  Filed Vitals:   07/10/14 2125 07/11/14 0115 07/11/14 0606 07/11/14 0753  BP: 103/60  106/60   Pulse: 74  80   Temp: 98 F (36.7 C)  98 F (36.7 C)   TempSrc: Oral  Oral   Resp: 18  18   Height:      Weight:      SpO2: 98% 97% 99% 96%    Intake/Output Summary (Last 24 hours) at 07/11/14 1354 Last data filed at 07/11/14 9983  Gross per 24 hour  Intake   1380 ml  Output      0 ml  Net   1380 ml   Filed Weights   07/08/14 1305 07/08/14 1900  Weight: 63.504 kg (140 lb) 59.875 kg (132 lb)    General appearance: alert, cooperative,  appears stated age and no distress Resp: improved air entry bilaterally. No wheezing today. No crackles.  Cardio: regular rate and rhythm, S1, S2 normal, no murmur, click, rub or gallop GI: soft. Some tenderness over anterior abdominal musculature. Doesn't appear to be intraabdominal. BS present. no masses,  no organomegaly Extremities: extremities normal, atraumatic, no cyanosis or edema  Lab Results:  Basic Metabolic Panel:  Recent Labs Lab 07/08/14 1335 07/08/14 2030 07/09/14 0431 07/11/14 0423  NA 138  --  138 139  K 4.3  --  3.8 4.2  CL 99  --  97 101  CO2 25  --  25 27  GLUCOSE 142*  --  152* 134*  BUN 24*  --  18 23  CREATININE 0.90 0.84 0.77 0.98  CALCIUM 9.5  --  8.8 9.0   Liver Function Tests:  Recent Labs Lab 07/09/14 0431  AST 12  ALT 14  ALKPHOS 53  BILITOT 0.3  PROT 6.3  ALBUMIN 3.1*   CBC:  Recent Labs Lab 07/08/14 1335 07/08/14 2030 07/09/14 0431 07/11/14 0423  WBC 20.2* 18.8* 21.0* 14.9*  NEUTROABS 17.5*  --   --   --   HGB 14.1  12.6 12.3 11.8*  HCT 41.3 37.6 36.7 34.9*  MCV 93.4 94.5 95.3 94.6  PLT 352 349 329 336   CBG:  Recent Labs Lab 07/10/14 1131 07/10/14 1651 07/10/14 2128 07/11/14 0754 07/11/14 1157  GLUCAP 115* 138* 144* 125* 125*    Studies/Results: Dg Abd Acute W/chest  07/11/2014   CLINICAL DATA:  Left lower abdominal pain and nausea, weakness  EXAM: ACUTE ABDOMEN SERIES (ABDOMEN 2 VIEW & CHEST 1 VIEW)  COMPARISON:  07/10/2014  FINDINGS: Cardiomediastinal silhouette is stable. No acute infiltrate or pleural effusion. No pulmonary edema. There is nonspecific nonobstructive bowel gas pattern. Abundant colonic stool. Moderate gas noted within rectum. No free abdominal air.  IMPRESSION: No acute disease within chest. Nonspecific nonobstructive small bowel gas pattern. Abundant colonic stool. Moderate gas noted within rectum   Electronically Signed   By: Lahoma Crocker M.D.   On: 07/11/2014 09:43    Medications:  Scheduled: .  albuterol  2.5 mg Nebulization Q6H WA  . amitriptyline  50 mg Oral QHS  . budesonide (PULMICORT) nebulizer solution  0.25 mg Nebulization BID  . calcium carbonate  1 tablet Oral BID WC  . cholecalciferol  1,000 Units Oral Daily  . conjugated estrogens  1 Applicatorful Vaginal Once per day on Tue Thu Sat  . docusate sodium  100 mg Oral BID  . folic acid  1 mg Oral Daily  . heparin  5,000 Units Subcutaneous 3 times per day  . insulin aspart  0-9 Units Subcutaneous TID WC  . levofloxacin  500 mg Oral Daily  . levothyroxine  175 mcg Oral QAC breakfast  . multivitamin with minerals  1 tablet Oral Daily  . omega-3 acid ethyl esters  1 g Oral BID  . pantoprazole  40 mg Oral Daily  . polycarbophil  625 mg Oral Daily  . polyethylene glycol  17 g Oral BID  . predniSONE  60 mg Oral BID WC  . thiamine  100 mg Oral Daily  . triamterene-hydrochlorothiazide  1 tablet Oral Daily  . vitamin C  500 mg Oral Daily   Continuous: . sodium chloride 50 mL/hr at 07/10/14 1548   YQM:[id number] **OR** acetaminophen, alum & mag hydroxide-simeth, benzonatate, bisacodyl, guaifenesin, [COMPLETED]  HYDROmorphone (DILAUDID) injection **FOLLOWED BY** HYDROmorphone (DILAUDID) injection, hydrOXYzine, ipratropium-albuterol, LORazepam, methocarbamol, oxyCODONE, promethazine, traMADol  Assessment/Plan:  Principal Problem:   Acute bronchitis Active Problems:   GERD (gastroesophageal reflux disease)   Hypothyroidism   Status asthmaticus    Possible Asthma Exacerbation/Acute Bronchitis Much better today. Will change to oral steroids and antibiotics. PRN/scheduled nebulizer tx. Analgesics for pain related to coughing spells. CXR was unremarkable on admission. Elevated WBC is due to steroids and is improving.  LLQ Abdominal Pain Likely musculoskeletal due to coughing spells. AAS was ordered and showed no acute findings. Miralax for constipation. Ambulate. K pad. Muscle relaxants.  Hypothyroidism: acquired and  secondary to thyroidectomy from thyroid cancer Continue current synthroid supplementation. Free T4 WNL. TSH as expected is suppressed (0.012)  History of GERD Continue PPI  Recent Allergic Reaction with Facial Rash Improved.   Tobacco abuse Cessation counseling provided  Hyperglycemia Due to steroids. But patient with A1C of 5.9. SSI.   DVT Prophylaxis: Heparin    Code Status: Full Code  Family Communication: Discussed with patient  Disposition Plan: Possible DC in AM.    LOS: 3 days   [facility] Pager [phone] 07/11/2014, 1:54 PM  If 8PM-8AM, please contact night-coverage at [email or web address], password [facility]

## 2014-07-12 LAB — CBC
HCT: 38.1 % (ref 36.0–46.0)
Hemoglobin: 12.6 g/dL (ref 12.0–15.0)
MCH: 31.3 pg (ref 26.0–34.0)
MCHC: 33.1 g/dL (ref 30.0–36.0)
MCV: 94.8 fL (ref 78.0–100.0)
Platelets: 369 10*3/uL (ref 150–400)
RBC: 4.02 MIL/uL (ref 3.87–5.11)
RDW: 13.5 % (ref 11.5–15.5)
WBC: 19.9 10*3/uL — ABNORMAL HIGH (ref 4.0–10.5)

## 2014-07-12 LAB — COMPREHENSIVE METABOLIC PANEL
ALT: 22 U/L (ref 0–35)
ANION GAP: 11 (ref 5–15)
AST: 15 U/L (ref 0–37)
Albumin: 3.2 g/dL — ABNORMAL LOW (ref 3.5–5.2)
Alkaline Phosphatase: 51 U/L (ref 39–117)
BUN: 25 mg/dL — AB (ref 6–23)
CO2: 29 mEq/L (ref 19–32)
Calcium: 9.5 mg/dL (ref 8.4–10.5)
Chloride: 95 mEq/L — ABNORMAL LOW (ref 96–112)
Creatinine, Ser: 0.89 mg/dL (ref 0.50–1.10)
GFR calc non Af Amer: 71 mL/min — ABNORMAL LOW (ref >90)
GFR, EST AFRICAN AMERICAN: 82 mL/min — AB (ref >90)
Glucose, Bld: 130 mg/dL — ABNORMAL HIGH (ref 70–99)
Potassium: 4.9 mEq/L (ref 3.7–5.3)
Sodium: 135 mEq/L — ABNORMAL LOW (ref 137–147)
TOTAL PROTEIN: 6 g/dL (ref 6.0–8.3)
Total Bilirubin: <0.2 mg/dL — ABNORMAL LOW (ref 0.3–1.2)

## 2014-07-12 LAB — GLUCOSE, CAPILLARY: Glucose-Capillary: 108 mg/dL — ABNORMAL HIGH (ref 70–99)

## 2014-07-12 MED ORDER — OMEPRAZOLE 20 MG PO CPDR: 20.0000 mg | DELAYED_RELEASE_CAPSULE | Freq: Every day | ORAL | Status: DC

## 2014-07-12 MED ORDER — ALBUTEROL SULFATE (2.5 MG/3ML) 0.083% IN NEBU: 2.5000 mg | INHALATION_SOLUTION | Freq: Four times a day (QID) | RESPIRATORY_TRACT | Status: AC | PRN

## 2014-07-12 MED ORDER — POLYETHYLENE GLYCOL 3350 17 G PO PACK: 17.0000 g | PACK | Freq: Every day | ORAL | Status: DC

## 2014-07-12 MED ORDER — OXYCODONE HCL 5 MG PO TABS: 5.0000 mg | ORAL_TABLET | ORAL | Status: DC | PRN

## 2014-07-12 MED ORDER — DSS 100 MG PO CAPS: 100.0000 mg | ORAL_CAPSULE | Freq: Two times a day (BID) | ORAL | Status: DC

## 2014-07-12 MED ORDER — BENZONATATE 200 MG PO CAPS: 200.0000 mg | ORAL_CAPSULE | Freq: Three times a day (TID) | ORAL | Status: DC | PRN

## 2014-07-12 MED ORDER — PREDNISONE 20 MG PO TABS: ORAL_TABLET | ORAL | Status: DC

## 2014-07-12 MED ORDER — GUAIFENESIN 100 MG/5ML PO SYRP: 200.0000 mg | ORAL_SOLUTION | ORAL | Status: DC | PRN

## 2014-07-12 MED ORDER — METHOCARBAMOL 500 MG PO TABS: 500.0000 mg | ORAL_TABLET | Freq: Four times a day (QID) | ORAL | Status: DC | PRN

## 2014-07-12 MED ORDER — LEVOFLOXACIN 500 MG PO TABS: 500.0000 mg | ORAL_TABLET | Freq: Every day | ORAL | Status: DC

## 2014-07-12 NOTE — Discharge Instructions (Signed)

## 2014-07-12 NOTE — Discharge Summary (Signed)
[facility]  Physician Discharge Summary   Patient ID: Sarah Huber MRN: [id number] DOB/AGE: 57-Feb-1957 57 y.o.  Admit date: 07/08/2014 Discharge date: 07/12/2014  PCP: [name], MD  DISCHARGE DIAGNOSES:  Principal Problem:   Acute bronchitis Active Problems:   GERD (gastroesophageal reflux disease)   Hypothyroidism   Status asthmaticus   RECOMMENDATIONS FOR OUTPATIENT FOLLOW UP: 1. Nebulizer machine provided to patient. 2. Asked to stay away from cigarettes  DISCHARGE CONDITION: fair  Diet recommendation: Low Sodium  Filed Weights   07/08/14 1305 07/08/14 1900  Weight: 63.504 kg (140 lb) 59.875 kg (132 lb)    INITIAL HISTORY: Sarah Huber is a 58 y.o. female who presented with difficulty breathing. She stated that she had a cough and also felt feverish. She was admitted to the hospital for further management.  Consultations:  None  Procedures:  None  HOSPITAL COURSE:   Possible Asthma Exacerbation/Acute Bronchitis She is much improved. She did have an episode of wheezing last night requiring nebulizer treatment. She still has pain with coughing in her chest area and her lower abdomen. She has been ambulating without any difficulty. She is keen on going home. Lungs reveal just scattered wheezing at this time. The reason for the onset of asthma. Is not entirely clear. She will discuss pulmonary function tests and allergy tests with her primary care physician. She'll be discharged on antibiotics and steroid taper, nebulizer treatments. Continue with her inhaled steroids at home. Leukocytosis is most likely due to steroids.  LLQ Abdominal Pain Likely musculoskeletal due to coughing spells. AAS was ordered and showed no acute findings. Constipation was noted and so she was prescribed may relax. Heating packs may provide relief. Muscle relaxants as needed. She's been asked to seek attention if her pain does not resolve or if it gets  worse.  Hypothyroidism: acquired and secondary to thyroidectomy from thyroid cancer Continue current synthroid supplementation. Free T4 WNL. TSH as expected is suppressed (0.012)  History of GERD PPI has been prescribed. Especially considering her new onset reactive airway disease.  Recent Allergic Reaction with Facial Rash Improved.   Tobacco abuse Cessation counseling provided  Hyperglycemia Due to steroids. But patient with A1C of 5.9. This should resolve as the steroids are tapered off.  Patient is improved from a respiratory standpoint. She still has some muscular pain in her chest and abdominal area due to coughing spells. She has been ambulating, however. She wants to go home. She was noted to have a small bruise in her lower abdomen. This was tender. Hemoglobin is stable.  She stable for discharge. She will need close follow-up with her PCP.  PERTINENT LABS:  The results of significant diagnostics from this hospitalization (including imaging, microbiology, ancillary and laboratory) are listed below for reference.     Labs: Basic Metabolic Panel:  Recent Labs Lab 07/08/14 1335 07/08/14 2030 07/09/14 0431 07/11/14 0423 07/12/14 0445  NA 138  --  138 139 135*  K 4.3  --  3.8 4.2 4.9  CL 99  --  97 101 95*  CO2 25  --  25 27 29   GLUCOSE 142*  --  152* 134* 130*  BUN 24*  --  18 23 25*  CREATININE 0.90 0.84 0.77 0.98 0.89  CALCIUM 9.5  --  8.8 9.0 9.5   Liver Function Tests:  Recent Labs Lab 07/09/14 0431 07/12/14 0445  AST 12 15  ALT 14 22  ALKPHOS 53 51  BILITOT 0.3 <0.2*  PROT 6.3 6.0  ALBUMIN  3.1* 3.2*   CBC:  Recent Labs Lab 07/08/14 1335 07/08/14 2030 07/09/14 0431 07/11/14 0423 07/12/14 0445  WBC 20.2* 18.8* 21.0* 14.9* 19.9*  NEUTROABS 17.5*  --   --   --   --   HGB 14.1 12.6 12.3 11.8* 12.6  HCT 41.3 37.6 36.7 34.9* 38.1  MCV 93.4 94.5 95.3 94.6 94.8  PLT 352 349 329 336 369   CBG:  Recent Labs Lab 07/11/14 0754 07/11/14 1157  07/11/14 1652 07/11/14 2108 07/12/14 0735  GLUCAP 125* 125* 136* 158* 108*     IMAGING STUDIES Dg Chest 2 View  07/08/2014   CLINICAL DATA:  Cough, congestion  EXAM: CHEST  2 VIEW  COMPARISON:  01/15/2014  FINDINGS: The heart size and mediastinal contours are within normal limits. Both lungs are clear. The visualized skeletal structures are unremarkable.  IMPRESSION: No active cardiopulmonary disease.   Electronically Signed   By: Kathreen Devoid   On: 07/08/2014 12:10   Dg Abd Acute W/chest  07/11/2014   CLINICAL DATA:  Left lower abdominal pain and nausea, weakness  EXAM: ACUTE ABDOMEN SERIES (ABDOMEN 2 VIEW & CHEST 1 VIEW)  COMPARISON:  07/10/2014  FINDINGS: Cardiomediastinal silhouette is stable. No acute infiltrate or pleural effusion. No pulmonary edema. There is nonspecific nonobstructive bowel gas pattern. Abundant colonic stool. Moderate gas noted within rectum. No free abdominal air.  IMPRESSION: No acute disease within chest. Nonspecific nonobstructive small bowel gas pattern. Abundant colonic stool. Moderate gas noted within rectum   Electronically Signed   By: Lahoma Crocker M.D.   On: 07/11/2014 09:43    DISCHARGE EXAMINATION: Filed Vitals:   07/11/14 2055 07/11/14 2134 07/12/14 0151 07/12/14 0502  BP:  118/74  104/68  Pulse: 89 84  72  Temp:  98.1 F (36.7 C)  98 F (36.7 C)  TempSrc:  Oral  Oral  Resp: 18 18  16   Height:      Weight:      SpO2: 98% 100% 97% 98%   General appearance: alert, cooperative, appears stated age and no distress Resp: Very few scattered wheezing bilaterally. No crackles. Cardio: regular rate and rhythm, S1, S2 normal, no murmur, click, rub or gallop GI: Abdomen is soft. Tenderness appreciated in the left lower quadrant, but predominantly in the abdominal wall musculature. Does not appear to be intra-abdominal. There is a small bruise in that area. No bruising over the flanks. Bowel sounds are present. No masses or organomegaly. No rebound, rigidity  or guarding. Extremities: extremities normal, atraumatic, no cyanosis or edema  DISPOSITION: Home  Discharge Instructions    Call MD for:  difficulty breathing, headache or visual disturbances    Complete by:  As directed      Call MD for:  extreme fatigue    Complete by:  As directed      Call MD for:  persistant dizziness or light-headedness    Complete by:  As directed      Call MD for:  persistant nausea and vomiting    Complete by:  As directed      Call MD for:  severe uncontrolled pain    Complete by:  As directed      Diet - low sodium heart healthy    Complete by:  As directed      Discharge instructions    Complete by:  As directed   Please see your PCP next week.     Increase activity slowly    Complete by:  As directed            ALLERGIES:  Allergies  Allergen Reactions  . Adhesive [Tape] Other (See Comments)    BLISTERS  . Hydrocodone Other (See Comments)    SEVERE HEADACHE  . Morphine And Related Other (See Comments)    SEVERE HEADACHE  . Aspirin Palpitations  . Penicillins Palpitations    Current Discharge Medication List    START taking these medications   Details  albuterol (PROVENTIL) (2.5 MG/3ML) 0.083% nebulizer solution Take 3 mLs (2.5 mg total) by nebulization every 6 (six) hours as needed for wheezing or shortness of breath. Qty: 75 mL, Refills: 3    benzonatate (TESSALON) 200 MG capsule Take 1 capsule (200 mg total) by mouth 3 (three) times daily as needed for cough (excessive coughing). Qty: 30 capsule, Refills: 0    docusate sodium 100 MG CAPS Take 100 mg by mouth 2 (two) times daily. Qty: 60 capsule, Refills: 0    guaifenesin (ROBITUSSIN) 100 MG/5ML syrup Take 10 mLs (200 mg total) by mouth every 4 (four) hours as needed for congestion. Qty: 120 mL, Refills: 0    levofloxacin (LEVAQUIN) 500 MG tablet Take 1 tablet (500 mg total) by mouth daily. For 4 more days. Qty: 4 tablet, Refills: 0    methocarbamol (ROBAXIN) 500 MG tablet  Take 1 tablet (500 mg total) by mouth every 6 (six) hours as needed for muscle spasms. Qty: 30 tablet, Refills: 0    omeprazole (PRILOSEC) 20 MG capsule Take 1 capsule (20 mg total) by mouth daily. Qty: 30 capsule, Refills: 0    oxyCODONE (OXY IR/ROXICODONE) 5 MG immediate release tablet Take 1 tablet (5 mg total) by mouth every 4 (four) hours as needed for moderate pain or severe pain. Qty: 20 tablet, Refills: 0    polyethylene glycol (MIRALAX / GLYCOLAX) packet Take 17 g by mouth daily. Qty: 30 each, Refills: 0      CONTINUE these medications which have CHANGED   Details  predniSONE (DELTASONE) 20 MG tablet Take 3 tablets twice daily for 3 days, then take 3 tablets once daily for 5 days, then take 2 tablets once daily for 4 days, then take 1 tablet once daily for 4 days, then STOP. Qty: 45 tablet, Refills: 0      CONTINUE these medications which have NOT CHANGED   Details  albuterol (PROVENTIL HFA;VENTOLIN HFA) 108 (90 BASE) MCG/ACT inhaler 2 inhaltions tid prn wheezing or SOB Qty: 1 Inhaler, Refills: 0    amitriptyline (ELAVIL) 50 MG tablet Take 50 mg by mouth at bedtime.    butalbital-acetaminophen-caffeine (FIORICET, ESGIC) 50-325-40 MG per tablet Take 1 tablet by mouth 2 (two) times daily as needed for headache.    Calcium-Vitamin D-Vitamin K (RA CALCIUM SOFT CHEWS) 500-200-40 MG-UNT-MCG CHEW Chew 2 tablets by mouth at bedtime.    cholecalciferol (VITAMIN D) 1000 UNITS tablet Take 1,000 Units by mouth daily.    conjugated estrogens (PREMARIN) vaginal cream Place 1 Applicatorful vaginally 3 (three) times a week. Tuesday/Thursday/Saturday    fish oil-omega-3 fatty acids 1000 MG capsule Take 1 g by mouth at bedtime.     hydrOXYzine (ATARAX/VISTARIL) 10 MG tablet Take one tablet bid prn itching Qty: 30 tablet, Refills: 0    levothyroxine (SYNTHROID, LEVOTHROID) 175 MCG tablet Take 175 mcg by mouth daily.     LORazepam (ATIVAN) 1 MG tablet Take 1/2 tablet hs prn sleep. May  repeat in 20 mins one time only Qty: 10 tablet, Refills: 0  mometasone-formoterol (DULERA) 100-5 MCG/ACT AERO Inhale 2 puffs into the lungs 2 (two) times daily. Qty: 1 Inhaler, Refills: 2    Multiple Vitamin (MULTIVITAMIN) tablet Take 1 tablet by mouth at bedtime.     polycarbophil (FIBERCON) 625 MG tablet Take 625 mg by mouth at bedtime.     promethazine (PHENERGAN) 25 MG tablet Take 25 mg by mouth at bedtime.     triamterene-hydrochlorothiazide (MAXZIDE) 75-50 MG per tablet Take 1 tablet by mouth daily.    vitamin C (ASCORBIC ACID) 500 MG tablet Take 500 mg by mouth every evening.       STOP taking these medications     traMADol (ULTRAM) 50 MG tablet        Follow-up Information    Follow up with SCHOENHOFF,DEBBIE, MD. Schedule an appointment as soon as possible for a visit in 1 week.   Specialty:  Internal Medicine   Why:  post hospitalization follow up   Contact information:   [place] [address] [phone]       TOTAL DISCHARGE TIME: 35 mins  [facility] Pager [phone]  07/12/2014, 12:11 PM

## 2014-07-12 NOTE — Care Management Note (Signed)
CARE MANAGEMENT NOTE 07/12/2014  Patient:  Sarah Huber, Sarah Huber   Account Number:  [id number]  Date Initiated:  07/10/2014  Documentation initiated by:  Marney Doctor  Subjective/Objective Assessment:   58 yo admitted with Status Asthmaticus. Hx of thyroid ca     Action/Plan:   From home with spouse.   Anticipated DC Date:  07/12/2014   Anticipated DC Plan:  [place]  CM consult      Choice offered to / List presented to:     DME arranged  NEBULIZER MACHINE      DME agency  [facility].        Status of service:  In process, will continue to follow Medicare Important Message given?   (If response is "NO", the following Medicare IM given date fields will be blank) Date Medicare IM given:   Medicare IM given by:   Date Additional Medicare IM given:   Additional Medicare IM given by:    Discharge Disposition:    Per UR Regulation:  Reviewed for med. necessity/level of care/duration of stay  If discussed at [place] of Stay Meetings, dates discussed:    Comments:  07/12/14 Marney Doctor RN,BSN,NCM Pt to dc home today with nebulizer.  [facility] referral made.  07/10/14 Marney Doctor RN,BSN,NCM [phone] Chart reviewed and CM following for DC needs.

## 2014-07-12 NOTE — Progress Notes (Signed)
[facility] is providing the following services: Nebulizer  If patient discharges after hours, please call [phone].   Linward Headland 07/12/2014, 10:34 AM

## 2014-07-12 NOTE — Progress Notes (Signed)
Pt on the phone when approached about taking scheduled neb tx. Pt asking if I can check back later. RT will continue to monitor.

## 2014-07-16 ENCOUNTER — Telehealth: Payer: Self-pay

## 2014-07-16 NOTE — Telephone Encounter (Signed)
Sarah Huber [phone]  [name] called to talk with Dr Coralyn Mark about being in hospital and what follow up plans she wants for her, she wanted to talk with her before Holiday. She was in hospital 07-08-14 till 07-12-14

## 2014-07-17 ENCOUNTER — Telehealth: Payer: Self-pay | Admitting: Internal Medicine

## 2014-07-17 ENCOUNTER — Other Ambulatory Visit: Payer: Self-pay | Admitting: Internal Medicine

## 2014-07-17 DIAGNOSIS — J4551 Severe persistent asthma with (acute) exacerbation: Secondary | ICD-10-CM

## 2014-07-17 MED ORDER — BENZONATATE 200 MG PO CAPS: 200.0000 mg | ORAL_CAPSULE | Freq: Three times a day (TID) | ORAL | Status: AC | PRN

## 2014-07-17 MED ORDER — LORAZEPAM 1 MG PO TABS: 1.0000 mg | ORAL_TABLET | Freq: Every evening | ORAL | Status: DC | PRN

## 2014-07-17 NOTE — Telephone Encounter (Signed)
RX for Ativan called in to [facility] -eh

## 2014-07-17 NOTE — Telephone Encounter (Signed)
Spoke with pt.  .  Her breathing is much better but tried to take one neb per day and "this did not work"  Still coughing but less,  Chest hurts severely when coughing.   Will need referral to pulmonology and allergist  Will re-order  Tessalon 200 mg,  Ocycodone for chest pain when coughing,  Lorazepam 1 mg prn sleep   ADvised to use nebulizer tid ,  Continue prednisone and see me next week.  Any worsening or SOB she is advised ot go to  ER

## 2014-07-21 ENCOUNTER — Other Ambulatory Visit: Payer: Self-pay | Admitting: Internal Medicine

## 2014-07-22 ENCOUNTER — Telehealth: Payer: Self-pay | Admitting: *Deleted

## 2014-07-22 NOTE — Telephone Encounter (Signed)
Sarah Huber has an appointment with Dr. Melvyn Novas on 07/25/14 @ 3Pm She is aware-eh

## 2014-07-22 NOTE — Telephone Encounter (Signed)
Refill request

## 2014-07-23 ENCOUNTER — Telehealth: Payer: Self-pay | Admitting: Pulmonary Disease

## 2014-07-23 ENCOUNTER — Other Ambulatory Visit: Payer: Self-pay | Admitting: *Deleted

## 2014-07-23 ENCOUNTER — Encounter: Payer: Self-pay | Admitting: Internal Medicine

## 2014-07-23 ENCOUNTER — Ambulatory Visit (INDEPENDENT_AMBULATORY_CARE_PROVIDER_SITE_OTHER): Payer: 59 | Admitting: Internal Medicine

## 2014-07-23 VITALS — BP 94/62 | HR 81 | Temp 98.6°F | Resp 16 | Ht 65.5 in | Wt 140.0 lb

## 2014-07-23 DIAGNOSIS — J3089 Other allergic rhinitis: Secondary | ICD-10-CM

## 2014-07-23 DIAGNOSIS — J209 Acute bronchitis, unspecified: Secondary | ICD-10-CM

## 2014-07-23 MED ORDER — METHOCARBAMOL 500 MG PO TABS: 500.0000 mg | ORAL_TABLET | Freq: Four times a day (QID) | ORAL | Status: DC | PRN

## 2014-07-23 NOTE — Progress Notes (Signed)
Subjective:    Patient ID: Sarah Huber, female    DOB: 1955-12-24, 58 y.o.   MRN: [id number]  HPI  Sarah Huber is here for HFU.  Admitted 11/16 - 11/20 with status asthmaticus and bronchitis.  Episode preceded by severe facial hypersensitivey reaction due to topical lotion.  Lots of facial edema . Recovery complicated by multiple neck surgeries with resultant scar tissue and difficulty expectorating mucous.  She is also S/P nissen fundoplication for severe GERD,   She is now using home nebs bid with 40 mg prednisone taper to 20 mg for 4 days and she will stop.    Breathing  better but still winded with exertion.  No wheezing.  Rare cough and no night time symptoms   REports she had allergic asthma many years ago but has been symptom free for many years.    Allergies  Allergen Reactions  . Adhesive [Tape] Other (See Comments)    BLISTERS  . Hydrocodone Other (See Comments)    SEVERE HEADACHE  . Morphine And Related Other (See Comments)    SEVERE HEADACHE  . Aspirin Palpitations  . Penicillins Palpitations   Past Medical History  Diagnosis Date  . Headache(784.0)     migraines  . Seasonal allergies   . Personal history of asthma     "allergic asthma"  . History of hiatal hernia     repaired 2011  . GERD (gastroesophageal reflux disease)     occasionally; no current med.  . Hypothyroidism     s/p thyroidectomy  . Chronic abdominal pain     left side - states is scar tissue from previous surgeries  . Difficulty swallowing     food and pills  . 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 Complication of anesthesia     small airway; anesthesia awareness; states remembers things that occur during surgery  . Mass of axilla 11/2012    left  . Cancer     thyroid  . Asthma    Past Surgical History  Procedure Laterality Date  . Appendectomy    . Thyroidectomy    . Splenectomy, total    . Lipoma excision Left 10/19/2001    groin  . Esophagogastric  fundoplasty  12/09/2009    laparoscopic takedown and mobilization of foregut  . Hiatal hernia repair  12/09/2009  . Nissen fundoplication      x 3  . Laparoscopic lysis of adhesions  01/27/2011    resection of scar tissue; wound exploration  . Total abdominal hysterectomy w/ bilateral salpingoophorectomy  07/30/2010  . Other surgical history  2004 - 2010    vocal cord surgery x 6  . Mass excision Left 12/20/2012    Procedure: EXCISION MASS LEFT AXILLA;  Surgeon: Merrie Roof, MD;  Location: [facility];  Service: General;  Laterality: Left;   History   Social History  . Marital Status: Married    Spouse Name: N/A    Number of Children: N/A  . Years of Education: N/A   Occupational History  . Not on file.   Social History Main Topics  . Smoking status: Current Some Day Smoker  . Smokeless tobacco: Never Used     Comment: quit smoking 2010  . Alcohol Use: 0.0 oz/week     Comment: occasionally  . Drug Use: No  . Sexual Activity: Yes   Other Topics Concern  . Not on file   Social  History Narrative   Family History  Problem Relation Age of Onset  . Thyroid cancer Sister   . Colon cancer Maternal Grandfather   . Cancer Maternal Grandfather 82    colon   . Anesthesia problems Mother     "hard time coming out"  . Hypertension Mother   . Hyperlipidemia Mother   . Alcohol abuse Father    Patient Active Problem List   Diagnosis Date Noted  . Acute bronchitis 07/11/2014  . Status asthmaticus 07/08/2014  . Allergic rhinitis 03/04/2014  . 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 S/P thyroidectomy 12/31/2013  . Asthma 12/27/2013  . Fibrocystic breast disease 12/27/2013  .  S/P total hysteretomy and BSO  precancerous cervical changes per pt 12/27/2013  .   Thyroid cancer  Dr. Tod Persia 12/27/2013  . GERD (gastroesophageal reflux disease) 12/27/2013  . Osteopenia 12/27/2013  . Hypothyroidism 12/27/2013  . Status post Nissen fundoplication 44/96/7591  . Breast density  12/27/2013  . Seroma complicating a procedure 12/27/2012  . Lipoma of axilla left 11/17/2012   Current Outpatient Prescriptions on File Prior to Visit  Medication Sig Dispense Refill  . albuterol (PROVENTIL HFA;VENTOLIN HFA) 108 (90 BASE) MCG/ACT inhaler 2 inhaltions tid prn wheezing or SOB 1 Inhaler 0  . albuterol (PROVENTIL) (2.5 MG/3ML) 0.083% nebulizer solution Take 3 mLs (2.5 mg total) by nebulization every 6 (six) hours as needed for wheezing or shortness of breath. 75 mL 3  . amitriptyline (ELAVIL) 50 MG tablet Take 50 mg by mouth at bedtime.    . benzonatate (TESSALON) 200 MG capsule Take 1 capsule (200 mg total) by mouth 3 (three) times daily as needed for cough (excessive coughing). 60 capsule 0  . butalbital-acetaminophen-caffeine (FIORICET, ESGIC) 50-325-40 MG per tablet Take 1 tablet by mouth 2 (two) times daily as needed for headache.    . Calcium-Vitamin D-Vitamin K (RA CALCIUM SOFT CHEWS) 500-200-40 MG-UNT-MCG CHEW Chew 2 tablets by mouth at bedtime.    . cholecalciferol (VITAMIN D) 1000 UNITS tablet Take 1,000 Units by mouth daily.    Marland Kitchen conjugated estrogens (PREMARIN) vaginal cream Place 1 Applicatorful vaginally 3 (three) times a week. Tuesday/Thursday/Saturday    . fish oil-omega-3 fatty acids 1000 MG capsule Take 1 g by mouth at bedtime.     . hydrOXYzine (ATARAX/VISTARIL) 10 MG tablet TAKE 1 TABLET BY MOUTH TWICE A DAY AS NEEDED ITCHING 30 tablet 0  . levothyroxine (SYNTHROID, LEVOTHROID) 175 MCG tablet Take 175 mcg by mouth daily.     Marland Kitchen LORazepam (ATIVAN) 1 MG tablet Take 1 tablet (1 mg total) by mouth at bedtime as needed for sleep. 20 tablet 0  . mometasone-formoterol (DULERA) 100-5 MCG/ACT AERO Inhale 2 puffs into the lungs 2 (two) times daily. 1 Inhaler 2  . Multiple Vitamin (MULTIVITAMIN) tablet Take 1 tablet by mouth at bedtime.     Marland Kitchen oxyCODONE (OXY IR/ROXICODONE) 5 MG immediate release tablet Take 1 tablet (5 mg total) by mouth every 4 (four) hours as needed for  moderate pain or severe pain. 20 tablet 0  . polycarbophil (FIBERCON) 625 MG tablet Take 625 mg by mouth at bedtime.     . predniSONE (DELTASONE) 20 MG tablet Take 3 tablets twice daily for 3 days, then take 3 tablets once daily for 5 days, then take 2 tablets once daily for 4 days, then take 1 tablet once daily for 4 days, then STOP. 45 tablet 0  . promethazine (PHENERGAN) 25 MG tablet Take 25 mg by mouth at bedtime.     Marland Kitchen  triamterene-hydrochlorothiazide (MAXZIDE) 75-50 MG per tablet Take 1 tablet by mouth daily.    . vitamin C (ASCORBIC ACID) 500 MG tablet Take 500 mg by mouth every evening.     Marland Kitchen guaifenesin (ROBITUSSIN) 100 MG/5ML syrup Take 10 mLs (200 mg total) by mouth every 4 (four) hours as needed for congestion. (Patient not taking: Reported on 07/23/2014) 120 mL 0   No current facility-administered medications on file prior to visit.      Review of Systems See HPi    Objective:   Physical Exam Physical Exam  Nursing note and vitals reviewed.  Pulse ox 100% Constitutional: She is oriented to person, place, and time. She appears well-developed and well-nourished.  HENT:  Head: Normocephalic and atraumatic.  Cardiovascular: Normal rate and regular rhythm. Exam reveals no gallop and no friction rub.  No murmur heard.  Pulmonary/Chest: Breath sounds normal. She has no wheezes. She has no rales.   Good air flow Neurological: She is alert and oriented to person, place, and time.  Skin: Skin is warm and dry.  Psychiatric: She has a normal mood and affect. Her behavior is normal.        Assessment & Plan:  Recent status asthmaticus:  Continue bid nebs and can downwardly taper to once a day until evaluated by pulmonology.  Continue oral prednisone.  Will also refer to allergist.  She has upcoming appt for formal assessment next week with pulmonologist  Allergic asthma  She above she will make appt with Dr. Orvil Feil  Thyroid CA S/P thyroidectomy   S/p nissen  fundoplication/GERD  Gastroparesis  S/P slpleenectomy   Will see me in 8 weeks or sooner prn

## 2014-07-23 NOTE — Telephone Encounter (Signed)
Dr Rudene Anda , PCP called - pt prefers to be seen in [place] - pl reschedule OV with me at [facility] next Thursday - Ok to overbook 2pm. Pl cancel appt with MW on this Thursday.

## 2014-07-23 NOTE — Telephone Encounter (Signed)
Patient scheduled to see Dr. Elsworth Soho at 2pm on 12/10 at [place] office.  Lmtcb. 12/1

## 2014-07-23 NOTE — Patient Instructions (Signed)
Give pt number to [place] [facility] She will see Dr. Elsworth Soho   Give pt number to Dr. Tiajuana Amass she will make appt

## 2014-07-25 ENCOUNTER — Institutional Professional Consult (permissible substitution): Payer: 59 | Admitting: Internal Medicine

## 2014-07-25 NOTE — Telephone Encounter (Signed)
Lmtcb. 12/3

## 2014-07-26 NOTE — Telephone Encounter (Signed)
Patient confirmed appointment. Nothing further needed.

## 2014-08-01 ENCOUNTER — Encounter: Payer: Self-pay | Admitting: Pulmonary Disease

## 2014-08-01 ENCOUNTER — Ambulatory Visit (INDEPENDENT_AMBULATORY_CARE_PROVIDER_SITE_OTHER): Payer: 59 | Admitting: Pulmonary Disease

## 2014-08-01 VITALS — BP 128/74 | HR 97 | Ht 65.0 in | Wt 147.0 lb

## 2014-08-01 DIAGNOSIS — L509 Urticaria, unspecified: Secondary | ICD-10-CM

## 2014-08-01 DIAGNOSIS — J4541 Moderate persistent asthma with (acute) exacerbation: Secondary | ICD-10-CM

## 2014-08-01 DIAGNOSIS — J4540 Moderate persistent asthma, uncomplicated: Secondary | ICD-10-CM

## 2014-08-01 NOTE — Patient Instructions (Signed)
Allergy test- RAST panel - If positive , will add singulair Stay on dulera twice daily ALbuterol 2 puffs as needed for rescue Use Benadryl at night or claritin daytime daily Start protonix 40 mg daily for reflux x 6wks Call if breathing worse  Prednisone 10mg  tabs Rx to keep in case of emergency -Take 4 tabs  daily with food x 4 days, then 3 tabs daily x 4 days, then 2 tabs daily x 4 days, then 1 tab daily x4 days then stop. #40

## 2014-08-01 NOTE — Progress Notes (Signed)
Subjective:    Patient ID: Sarah Huber, female    DOB: 01/02/1956, 58 y.o.   MRN: [id number]  HPI   Chief Complaint  Patient presents with  . Advice Only    referred by Dr. Rudene Anda for asthma.  Was seen at [facility] ED about 1 month ago for this.  Came off prednisone taper yesterday, has had increased sob today and hives on face.    58 year old social smoker, presents for evaluation of asthma-like symptoms She denies diagnosis of asthma since childhood. Developed this in her 57s. She is maintained at least for the past 2 years and a regimen of dulera and albuterol. She reports her triggers has chemicals, tanninns in wine and strong perfumes and orders.  Admitted 11/16 - 11/20 with status asthmaticus and bronchitis.Diffuse wheezing was noted. Episode was preceded by severe facial hives. It is unclear if this was related to topical lotion. 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 She does admit to some degree of dysphagia She was maintained on prednisone until one day ago, she has developed hives again today  Environment - has 2 dogs and a pet parrot for 19 years. Otherwise no new Linen or makeup. She has tried changing all these chemicals without any difference Allergy to aspirin as noted, med review does not show NSAIDs  She smoked less than 10 pack years Past Medical History  Diagnosis Date  . Headache(784.0)     migraines  . Seasonal allergies   . Personal history of asthma     "allergic asthma"  . History of hiatal hernia     repaired 2011  . GERD (gastroesophageal reflux disease)     occasionally; no current med.  . Hypothyroidism     s/p thyroidectomy  . Chronic abdominal pain     left side - states is scar tissue from previous surgeries  . Difficulty swallowing     food and pills  . 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 Complication of anesthesia     small airway; anesthesia awareness; states remembers things that occur during surgery  . Mass of axilla 11/2012    left  . Cancer     thyroid  . Asthma     Past Surgical History  Procedure Laterality Date  . Appendectomy    . Thyroidectomy    . Splenectomy, total    . Lipoma excision Left 10/19/2001    groin  . Esophagogastric fundoplasty  12/09/2009    laparoscopic takedown and mobilization of foregut  . Hiatal hernia repair  12/09/2009  . Nissen fundoplication      x 3  . Laparoscopic lysis of adhesions  01/27/2011    resection of scar tissue; wound exploration  . Total abdominal hysterectomy w/ bilateral salpingoophorectomy  07/30/2010  . Other surgical history  2004 - 2010    vocal cord surgery x 6  . Mass excision Left 12/20/2012    Procedure: EXCISION MASS LEFT AXILLA;  Surgeon: Merrie Roof, MD;  Location: [place];  Service: General;  Laterality: Left;    Allergies  Allergen Reactions  . Adhesive [Tape] Other (See Comments)    BLISTERS  . Hydrocodone Other (See Comments)    SEVERE HEADACHE  . Morphine And Related Other (See Comments)  SEVERE HEADACHE  . Aspirin Palpitations  . Penicillins Palpitations   History   Social History  . Marital Status: Married    Spouse Name: N/A    Number of Children: N/A  . Years of Education: N/A   Occupational History  . Not on file.   Social History Main Topics  . Smoking status: Current Some Day Smoker -- 0.50 packs/day for 25 years    Types: Cigarettes    Last Attempt to Quit: 08/01/2013  . Smokeless tobacco: Never Used     Comment: still smokes occasionally when drinking.   . Alcohol Use: 0.0 oz/week    0 Not specified per week     Comment: occasionally  . Drug Use: No  . Sexual Activity: Yes   Other Topics Concern  . Not on file   Social History Narrative     Review  of Systems  Constitutional: Negative for fever and unexpected weight change.  HENT: Negative for congestion, dental problem, ear pain, nosebleeds, postnasal drip, rhinorrhea, sinus pressure, sneezing, sore throat and trouble swallowing.   Eyes: Negative for redness and itching.  Respiratory: Positive for cough, chest tightness and shortness of breath. Negative for wheezing.   Cardiovascular: Negative for palpitations and leg swelling.  Gastrointestinal: Negative for nausea and vomiting.  Genitourinary: Negative for dysuria.  Musculoskeletal: Negative for joint swelling.  Skin: Negative for rash.  Neurological: Negative for headaches.  Hematological: Does not bruise/bleed easily.  Psychiatric/Behavioral: Negative for dysphoric mood. The patient is not nervous/anxious.        Objective:   Physical Exam  Gen. Pleasant, thin woman, in mild distress, normal affect ENT - no lesions, no post nasal drip, hives over right side of face Neck: No JVD, no thyromegaly, no carotid bruits Lungs: no use of accessory muscles, no dullness to percussion, upper airway pseudo-wheeze ,clear without rales or rhonchi  Cardiovascular: Rhythm regular, heart sounds  normal, no murmurs or gallops, no peripheral edema Abdomen: soft and non-tender, no hepatosplenomegaly, BS normal. Musculoskeletal: No deformities, no cyanosis or clubbing Neuro:  alert, non focal       Assessment & Plan:

## 2014-08-02 DIAGNOSIS — L509 Urticaria, unspecified: Secondary | ICD-10-CM | POA: Insufficient documentation

## 2014-08-02 LAB — ALLERGY FULL PROFILE
Allergen, D pternoyssinus,d7: <0.1 kU/L
Alternaria Alternata: <0.1 kU/L
Aspergillus fumigatus, m3: <0.1 kU/L
BERMUDA GRASS: 0.18 kU/L — AB
Bahia Grass: <0.1 kU/L
Cat Dander: <0.1 kU/L
D. farinae: <0.1 kU/L
Elm IgE: <0.1 kU/L
FESCUE: 0.19 kU/L — AB
G005 Rye, Perennial: 0.18 kU/L — ABNORMAL HIGH
G009 Red Top: 0.17 kU/L — ABNORMAL HIGH
GOOSE FEATHERS: 2.92 kU/L — AB
HOUSE DUST HOLLISTER: 0.45 kU/L — AB
IGE (IMMUNOGLOBULIN E), SERUM: 20 kU/L (ref <115)
Lamb's Quarters: <0.1 kU/L
Plantain: <0.1 kU/L
Sycamore Tree: <0.1 kU/L
TIMOTHY GRASS: 0.18 kU/L — AB

## 2014-08-02 NOTE — Assessment & Plan Note (Signed)
Allergy test- RAST panel - If positive , will add singulair Stay on dulera twice daily ALbuterol 2 puffs as needed for rescue Use Benadryl at night or claritin daytime daily Start protonix 40 mg daily for reflux x 6wks Call if breathing worse  Prednisone 10mg  tabs Rx to keep in case of emergency -Take 4 tabs  daily with food x 4 days, then 3 tabs daily x 4 days, then 2 tabs daily x 4 days, then 1 tab daily x4 days then stop. #40

## 2014-08-02 NOTE — Assessment & Plan Note (Signed)
Trigger is unclear We discussed facial irritants-discontinuing makeup etc. May need allergy referral-if persistent in spite of H1 and H2 blockers

## 2014-08-06 ENCOUNTER — Telehealth: Payer: Self-pay | Admitting: Pulmonary Disease

## 2014-08-06 NOTE — Telephone Encounter (Signed)
Pt states that hives came back really bad this morning even after taking 2 Benadryl.  Has also stopped all makeup.   Has appt with allergist on Thursday and is not supposed to take any antihistamines prior.  Has not started Pred taper yet.  Please advise.

## 2014-08-06 NOTE — Telephone Encounter (Signed)
Start taking prednisone

## 2014-08-06 NOTE — Telephone Encounter (Signed)
[id number] x1 

## 2014-08-07 ENCOUNTER — Telehealth: Payer: Self-pay

## 2014-08-07 MED ORDER — PANTOPRAZOLE SODIUM 40 MG PO TBEC: 40.0000 mg | DELAYED_RELEASE_TABLET | Freq: Every day | ORAL | Status: DC

## 2014-08-07 MED ORDER — PREDNISONE 10 MG PO TABS: ORAL_TABLET | ORAL | Status: DC

## 2014-08-07 NOTE — Telephone Encounter (Signed)
Talked with Dr Coralyn Mark, can not fill pain medicine without seeing her, our next available appointment is tomorrow at 2:45. She has an appointment with Dr Orvil Feil allergist tomorrow at 1:30. So I advised her to go to Urgent Care, she did not want to do that. I also suggested calling Dr Seward Meth office and seeing if they might could possible get her in today.

## 2014-08-07 NOTE — Telephone Encounter (Signed)
Called and spoke to pt. Informed pt of the recs per RA. Pt verbalized understanding and denied any further questions or concerns at this time.   

## 2014-08-07 NOTE — Telephone Encounter (Addendum)
Sarah Huber [phone]  [facility] called to see if it was possible for her to get a refill on her pain medicine, her face is swollen and hurting really bad. She is seeing Dr Orvil Feil on Thursday for allergy test, so she can not take any anahistemens, because it will effect the test. She did say if she needs to come in she will be glad to she just needs some relief.

## 2014-08-07 NOTE — Telephone Encounter (Signed)
Spoke with pt, was informed that meds were not called in yesterday.  These have been e-scribed. Pt states that per her allergist she cannot take prednisone until after allergy test.  Pt has appt with allergist tomorrow at 1:30.  States that her rash is spreading and painful.  She was told that she cannot take prednisone or apply/take any antihistamines before her allergy testing tomorrow.  Is requesting some recs to get her through until tomorrow.    Dr. Elsworth Soho please advise on any recs for rash to get pt through to tomorrow.  Thanks!

## 2014-08-07 NOTE — Telephone Encounter (Signed)
[phone] returning call

## 2014-08-07 NOTE — Telephone Encounter (Signed)
Unfortunately she will have to take prednisone/ bendryl if her symptoms are so bad. She can take photos so that allergist can see them. Testing can be deferred until later Alternatively ask allergist office if they can get her in today (dr Orvil Feil)

## 2014-08-14 ENCOUNTER — Encounter: Payer: Self-pay | Admitting: *Deleted

## 2014-08-27 NOTE — Progress Notes (Signed)
Subjective:    Patient ID: Sarah Huber, female    DOB: 14-Feb-1956, 59 y.o.   MRN: [id number]  HPI 12/10 pulmonary note Allergy test- RAST panel - If positive , will add singulair Stay on dulera twice daily ALbuterol 2 puffs as needed for rescue Use Benadryl at night or claritin daytime daily Start protonix 40 mg daily for reflux x 6wks Call if breathing worse  Prednisone 10mg  tabs Rx to keep in case of emergency -Take 4 tabs daily with food x 4 days, then 3 tabs daily x 4 days, then 2 tabs daily x 4 days, then 1 tab daily x4 days then stop. #40            Hives of unknown origin - Rigoberto Noel, MD at 08/02/2014 4:13 PM     Status: Written Related Problem: Hives of unknown origin   Expand All Collapse All   Trigger is unclear We discussed facial irritants-discontinuing makeup etc. May need allergy referral-if persistent in spite of H1 and H2 blockers         TODAY:   Sarah Huber is here for CPE.  She is here with her husband Timmothy Sours   HM:  Mm done 2014,   Pap per GYN  She is S/P hysterectomy, colonoscopy  Dr. Collene Mares,   She is a smoker "very few"   She is a 30 pack year smoker  (began at age 55)  She does not wish DEXA now     Asthma/urticaria    [place]  Has seen Dr. Elsworth Soho and allergist.  She has upcoming appt with her dermatolgist.   Allergy testing is ongoing.      She has stopped her prednisone yesterday.    Etiology of urticaria still unclear.   She takes Benadryl intermittanly.   She is not using any topicals on her face .  She denies recent rescue inhaler use.  See peak flow  See Peak flow  .  This is a good result for [place] as she has a lot of scar tissue in her neck    Allergies  Allergen Reactions  . Adhesive [Tape] Other (See Comments)    BLISTERS  . Hydrocodone Other (See Comments)    SEVERE HEADACHE  . Morphine And Related Other (See Comments)    SEVERE HEADACHE  . Aspirin Palpitations  . Penicillins Palpitations   Past Medical History  Diagnosis Date    . Headache(784.0)     migraines  . Seasonal allergies   . Personal history of asthma     "allergic asthma"  . History of hiatal hernia     repaired 2011  . GERD (gastroesophageal reflux disease)     occasionally; no current med.  . Hypothyroidism     s/p thyroidectomy  . Chronic abdominal pain     left side - states is scar tissue from previous surgeries  . Difficulty swallowing     food and pills  . 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 Complication of anesthesia     small airway; anesthesia awareness; states remembers things that occur during surgery  . Mass of axilla 11/2012    left  . Cancer     thyroid  . Asthma    Past Surgical History  Procedure Laterality Date  . Appendectomy    . Thyroidectomy    . Splenectomy, total    . Lipoma excision Left 10/19/2001    groin  .  Esophagogastric fundoplasty  12/09/2009    laparoscopic takedown and mobilization of foregut  . Hiatal hernia repair  12/09/2009  . Nissen fundoplication      x 3  . Laparoscopic lysis of adhesions  01/27/2011    resection of scar tissue; wound exploration  . Total abdominal hysterectomy w/ bilateral salpingoophorectomy  07/30/2010  . Other surgical history  2004 - 2010    vocal cord surgery x 6  . Mass excision Left 12/20/2012    Procedure: EXCISION MASS LEFT AXILLA;  Surgeon: Merrie Roof, MD;  Location: [place];  Service: General;  Laterality: Left;   History   Social History  . Marital Status: Married    Spouse Name: N/A    Number of Children: N/A  . Years of Education: N/A   Occupational History  . Not on file.   Social History Main Topics  . Smoking status: Current Some Day Smoker -- 0.50 packs/day for 25 years    Types: Cigarettes    Last Attempt to Quit: 08/01/2013  . Smokeless tobacco: Never Used     Comment: still smokes occasionally when drinking.   . Alcohol Use: 0.0 oz/week    0 Not specified per week     Comment:  occasionally  . Drug Use: No  . Sexual Activity: Yes   Other Topics Concern  . Not on file   Social History Narrative   Family History  Problem Relation Age of Onset  . Thyroid cancer Sister   . Colon cancer Maternal Grandfather   . Cancer Maternal Grandfather 92    colon   . Anesthesia problems Mother     "hard time coming out"  . Hypertension Mother   . Hyperlipidemia Mother   . Alcohol abuse Father    Patient Active Problem List   Diagnosis Date Noted  . Hives of unknown origin 08/02/2014  . Allergic rhinitis 03/04/2014  . 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 S/P thyroidectomy 12/31/2013  . Asthma with acute exacerbation 12/27/2013  . Fibrocystic breast disease 12/27/2013  .  S/P total hysteretomy and BSO  precancerous cervical changes per pt 12/27/2013  .   Thyroid cancer  Dr. Tod Persia 12/27/2013  . GERD (gastroesophageal reflux disease) 12/27/2013  . Osteopenia 12/27/2013  . Hypothyroidism 12/27/2013  . Status post Nissen fundoplication 96/11/5407  . Breast density 12/27/2013  . Seroma complicating a procedure 12/27/2012  . Lipoma of axilla left 11/17/2012   Current Outpatient Prescriptions on File Prior to Visit  Medication Sig Dispense Refill  . albuterol (PROVENTIL HFA;VENTOLIN HFA) 108 (90 BASE) MCG/ACT inhaler 2 inhaltions tid prn wheezing or SOB 1 Inhaler 0  . albuterol (PROVENTIL) (2.5 MG/3ML) 0.083% nebulizer solution Take 3 mLs (2.5 mg total) by nebulization every 6 (six) hours as needed for wheezing or shortness of breath. 75 mL 3  . amitriptyline (ELAVIL) 50 MG tablet Take 50 mg by mouth at bedtime.    . benzonatate (TESSALON) 200 MG capsule Take 1 capsule (200 mg total) by mouth 3 (three) times daily as needed for cough (excessive coughing). 60 capsule 0  . Calcium-Vitamin D-Vitamin K (RA CALCIUM SOFT CHEWS) 500-200-40 MG-UNT-MCG CHEW Chew 2 tablets by mouth at bedtime.    . cholecalciferol (VITAMIN D) 1000 UNITS tablet Take 1,000 Units by mouth daily.    Marland Kitchen  conjugated estrogens (PREMARIN) vaginal cream Place 1 Applicatorful vaginally 3 (three) times a week. Tuesday/Thursday/Saturday    . fish oil-omega-3 fatty acids 1000 MG capsule Take 1 g  by mouth at bedtime.     Marland Kitchen guaifenesin (ROBITUSSIN) 100 MG/5ML syrup Take 10 mLs (200 mg total) by mouth every 4 (four) hours as needed for congestion. (Patient not taking: Reported on 08/01/2014) 120 mL 0  . hydrOXYzine (ATARAX/VISTARIL) 10 MG tablet TAKE 1 TABLET BY MOUTH TWICE A DAY AS NEEDED ITCHING 30 tablet 0  . levothyroxine (SYNTHROID, LEVOTHROID) 175 MCG tablet Take 175 mcg by mouth daily.     Marland Kitchen LORazepam (ATIVAN) 1 MG tablet Take 1 tablet (1 mg total) by mouth at bedtime as needed for sleep. 20 tablet 0  . methocarbamol (ROBAXIN) 500 MG tablet Take 1 tablet (500 mg total) by mouth every 6 (six) hours as needed for muscle spasms. 30 tablet 0  . mometasone-formoterol (DULERA) 100-5 MCG/ACT AERO Inhale 2 puffs into the lungs 2 (two) times daily. 1 Inhaler 2  . Multiple Vitamin (MULTIVITAMIN) tablet Take 1 tablet by mouth at bedtime.     . pantoprazole (PROTONIX) 40 MG tablet Take 1 tablet (40 mg total) by mouth daily. 30 tablet 2  . polycarbophil (FIBERCON) 625 MG tablet Take 625 mg by mouth at bedtime.     . predniSONE (DELTASONE) 10 MG tablet 40mg  X4 days, then 30mg  X4 days, then 20mg  X4 days, then 10mg  X4 days, then stop. 40 tablet 0  . promethazine (PHENERGAN) 25 MG tablet Take 25 mg by mouth at bedtime.     . triamterene-hydrochlorothiazide (MAXZIDE) 75-50 MG per tablet Take 1 tablet by mouth daily.    . vitamin C (ASCORBIC ACID) 500 MG tablet Take 500 mg by mouth every evening.      No current facility-administered medications on file prior to visit.       Review of Systems  Respiratory: Negative for cough, chest tightness, shortness of breath and wheezing.   Cardiovascular: Negative for chest pain, palpitations and leg swelling.       Objective:   Physical Exam  Physical Exam  Nursing  note and vitals reviewed.  Constitutional: She is oriented to person, place, and time. She appears well-developed and well-nourished.  HENT:  Head: Normocephalic and atraumatic.  Right Ear: Tympanic membrane and ear canal normal. No drainage. Tympanic membrane is not injected and not erythematous.  Left Ear: Tympanic membrane and ear canal normal. No drainage. Tympanic membrane is not injected and not erythematous.  Nose: Nose normal. Right sinus exhibits no maxillary sinus tenderness and no frontal sinus tenderness. Left sinus exhibits no maxillary sinus tenderness and no frontal sinus tenderness.  Mouth/Throat: Oropharynx is clear and moist. No oral lesions. No oropharyngeal exudate.  Eyes: Conjunctivae and EOM are normal. Pupils are equal, round, and reactive to light.  Neck: Normal range of motion. Neck supple. No JVD present. Carotid bruit is not present. No mass and no thyromegaly present.  Cardiovascular: Normal rate, regular rhythm, S1 normal, S2 normal and intact distal pulses. Exam reveals no gallop and no friction rub.  No murmur heard.  Pulses:  Carotid pulses are 2+ on the right side, and 2+ on the left side.  Dorsalis pedis pulses are 2+ on the right side, and 2+ on the left side.  No carotid bruit. No LE edema  Pulmonary/Chest: Breath sounds normal. She has no wheezes. She has no rales. She exhibits no tenderness.   Breast no discrete mass no nipple discharge no axillary adenopathy bilaterally  Abdominal: Soft. Bowel sounds are normal. She exhibits no distension and no mass. There is no hepatosplenomegaly. There is no tenderness. There  is no CVA tenderness.   Rectal no mass guaiac neg  Musculoskeletal: Normal range of motion.  No active synovitis to joints.  Lymphadenopathy:  She has no cervical adenopathy.  She has no axillary adenopathy.  Right: No inguinal and no supraclavicular adenopathy present.  Left: No inguinal and no supraclavicular adenopathy present.  Neurological:  She is alert and oriented to person, place, and time. She has normal strength and normal reflexes. She displays no tremor. No cranial nerve deficit or sensory deficit. Coordination and gait normal.  Skin: Skin is warm and dry. No rash noted. No cyanosis. Nails show no clubbing.  Psychiatric: She has a normal mood and affect. Her speech is normal and behavior is normal. Cognition and memory are normal.         Assessment & Plan:  HM:  Advised mm  Pt declines now ,  Colonoscopy per GI,     Counseled lung CA screening guideline,  Pt wishes to check with insurance.  I have advised lung CT for her   Hemocult neg  Pt had pneumovax and influenza  Declines prevnar 13 until she check with insurance   Tobacco use advised cessation   See above   Chemstrip pos blood on U/A  Will send for microscopy and culture  Pt asymptomatic  Asthma  No recent rescue inhaler use.   I gave RX for emergency prednisone if needed since pt is off steroids now.  .  She has epi pen as needed  Facial Urticaria  Undergoing eval with allergy and dermatology   Thyroid CA managed [place]  Hypothryoidism  Continue meds  Osteopenia  Pt is at high risk of osteoporosis  due to steroid use but she does not wishe DEXA now   Will readdress  Fibrocystic breast disease  Advised mm as she is due. Pt does not wish to schedule now

## 2014-08-28 ENCOUNTER — Encounter: Payer: Self-pay | Admitting: Internal Medicine

## 2014-08-28 ENCOUNTER — Ambulatory Visit (INDEPENDENT_AMBULATORY_CARE_PROVIDER_SITE_OTHER): Payer: 59 | Admitting: Internal Medicine

## 2014-08-28 ENCOUNTER — Encounter: Payer: Self-pay | Admitting: *Deleted

## 2014-08-28 VITALS — BP 115/70 | HR 97 | Resp 16 | Ht 65.5 in | Wt 141.0 lb

## 2014-08-28 DIAGNOSIS — J4540 Moderate persistent asthma, uncomplicated: Secondary | ICD-10-CM

## 2014-08-28 DIAGNOSIS — Z1211 Encounter for screening for malignant neoplasm of colon: Secondary | ICD-10-CM

## 2014-08-28 DIAGNOSIS — R319 Hematuria, unspecified: Secondary | ICD-10-CM | POA: Diagnosis not present

## 2014-08-28 DIAGNOSIS — E038 Other specified hypothyroidism: Secondary | ICD-10-CM | POA: Diagnosis not present

## 2014-08-28 DIAGNOSIS — J4541 Moderate persistent asthma with (acute) exacerbation: Secondary | ICD-10-CM

## 2014-08-28 DIAGNOSIS — Z0001 Encounter for general adult medical examination with abnormal findings: Secondary | ICD-10-CM | POA: Diagnosis not present

## 2014-08-28 DIAGNOSIS — M8580 Other specified disorders of bone density and structure, unspecified site: Secondary | ICD-10-CM

## 2014-08-28 DIAGNOSIS — Z0000 Encounter for general adult medical examination without abnormal findings: Secondary | ICD-10-CM

## 2014-08-28 DIAGNOSIS — K219 Gastro-esophageal reflux disease without esophagitis: Secondary | ICD-10-CM

## 2014-08-28 LAB — POCT URINALYSIS DIPSTICK
BILIRUBIN UA: NEGATIVE
Glucose, UA: NEGATIVE
KETONES UA: NEGATIVE
LEUKOCYTES UA: NEGATIVE
NITRITE UA: NEGATIVE
PH UA: 6.5
PROTEIN UA: NEGATIVE
Spec Grav, UA: 1.015
Urobilinogen, UA: NEGATIVE

## 2014-08-28 LAB — HEMOCCULT GUIAC POC 1CARD (OFFICE): Fecal Occult Blood, POC: NEGATIVE

## 2014-08-28 MED ORDER — BUTALBITAL-APAP-CAFF-COD 50-325-40-30 MG PO CAPS: 1.0000 | ORAL_CAPSULE | ORAL | Status: DC | PRN

## 2014-08-28 MED ORDER — METHOCARBAMOL 500 MG PO TABS: 500.0000 mg | ORAL_TABLET | Freq: Four times a day (QID) | ORAL | Status: AC | PRN

## 2014-08-28 MED ORDER — PREDNISONE 20 MG PO TABS: ORAL_TABLET | ORAL | Status: DC

## 2014-08-28 MED ORDER — LORAZEPAM 1 MG PO TABS: 1.0000 mg | ORAL_TABLET | Freq: Every evening | ORAL | Status: AC | PRN

## 2014-08-29 ENCOUNTER — Ambulatory Visit: Payer: 59 | Admitting: Pulmonary Disease

## 2014-08-29 LAB — URINALYSIS, MICROSCOPIC ONLY
CASTS: NONE SEEN
Crystals: NONE SEEN

## 2014-08-30 LAB — URINE CULTURE: Colony Count: >=100000

## 2014-09-18 ENCOUNTER — Telehealth: Payer: Self-pay

## 2014-09-18 ENCOUNTER — Encounter: Payer: Self-pay | Admitting: Internal Medicine

## 2014-09-18 ENCOUNTER — Ambulatory Visit (INDEPENDENT_AMBULATORY_CARE_PROVIDER_SITE_OTHER): Payer: 59 | Admitting: Internal Medicine

## 2014-09-18 VITALS — BP 123/76 | HR 110 | Temp 97.9°F | Resp 16 | Ht 65.5 in | Wt 139.0 lb

## 2014-09-18 DIAGNOSIS — R059 Cough, unspecified: Secondary | ICD-10-CM

## 2014-09-18 DIAGNOSIS — R05 Cough: Secondary | ICD-10-CM

## 2014-09-18 DIAGNOSIS — J9801 Acute bronchospasm: Secondary | ICD-10-CM

## 2014-09-18 DIAGNOSIS — L245 Irritant contact dermatitis due to other chemical products: Secondary | ICD-10-CM

## 2014-09-18 DIAGNOSIS — L239 Allergic contact dermatitis, unspecified cause: Secondary | ICD-10-CM

## 2014-09-18 DIAGNOSIS — R0602 Shortness of breath: Secondary | ICD-10-CM

## 2014-09-18 MED ORDER — METHYLPREDNISOLONE ACETATE 80 MG/ML IJ SUSP
160.0000 mg | Freq: Once | INTRAMUSCULAR | Status: AC
  Administered 2014-09-18: 160 mg via INTRAMUSCULAR

## 2014-09-18 MED ORDER — ALBUTEROL SULFATE (2.5 MG/3ML) 0.083% IN NEBU
2.5000 mg | INHALATION_SOLUTION | Freq: Once | RESPIRATORY_TRACT | Status: AC
  Administered 2014-09-18: 2.5 mg via RESPIRATORY_TRACT

## 2014-09-18 MED ORDER — AMITRIPTYLINE HCL 50 MG PO TABS: 50.0000 mg | ORAL_TABLET | Freq: Every day | ORAL | Status: DC

## 2014-09-18 NOTE — Progress Notes (Signed)
   Subjective:    Patient ID: Sarah Huber, female    DOB: 1956/07/17, 59 y.o.   MRN: [id number]  HPI 08/2014 note HM: Advised mm Pt declines now , Colonoscopy per GI, Counseled lung CA screening guideline, Pt wishes to check with insurance. I have advised lung CT for her Hemocult neg Pt had pneumovax and influenza Declines prevnar 13 until she check with insurance   Tobacco use advised cessation See above   Chemstrip pos blood on U/A Will send for microscopy and culture Pt asymptomatic  Asthma No recent rescue inhaler use. I gave RX for emergency prednisone if needed since pt is off steroids now. . She has epi pen as needed  Facial Urticaria Undergoing eval with allergy and dermatology   Thyroid CA managed [place]  Hypothryoidism Continue meds  Osteopenia Pt is at high risk of osteoporosis due to steroid use but she does not wishe DEXA now Will readdress  Fibrocystic breast disease Advised mm as she is due. Pt does not wish to schedule now   TODAY   Sarah Huber is here for acute visit.  She has had allergy testing by her dermatologist Dr. Nevada Crane and Dr. Donneta Romberg  And given a list of avoidance ingredieants   She began with coughing 3 days ago ,  Wheezing and facial swelling .  She did have a bleach hair treatment with foil   (no dye) 10 days ago.    She has not used any prednisone     Review of Systems See HPI    Objective:   Physical Exam  Physical Exam  Nursing note and vitals reviewed.   See peak flow and pulse ox Constitutional: She is oriented to person, place, and time. She appears well-developed and well-nourished.  HENT:  Head: Normocephalic and atraumatic.  She does have generalized redness and facial edema Cardiovascular: Normal rate and regular rhythm. Exam reveals no gallop and no friction rub.  No murmur heard.  Pulmonary/Chest: Breath sounds normal. She has end exp bilaterall  wheezes. She has no rales.  Neurological: She is alert and  oriented to person, place, and time.  Skin: Skin is warm and dry.  Psychiatric: She has a normal mood and affect. Her behavior is normal.        Assessment & Plan:  Facial contact dermatitis     She has steroid creme from dermatologsit  persistant allergic dermatitis:  Will refer to academic center  For further allergy assessment.  Pt prefers [facility]  OK to refer  Allergic  alsthma  HHN albuterol today  Depomedrol 160 mg im in office  She has Prednisone at home  Advised 60 mg taper by 20 mg every 3 days.    Pt advised if any worseneing resp compromise ,  She is to go to ER. She voices understandng

## 2014-09-18 NOTE — Telephone Encounter (Signed)
Left message stating we sent her referral to [facility] [address] [place].  They scheduled her for Feb 23rd at 9:45am. [phone].

## 2014-09-25 ENCOUNTER — Telehealth: Payer: Self-pay | Admitting: *Deleted

## 2014-09-25 MED ORDER — ISOMETHEPTENE-APAP-DICHLORAL 65-325-100 MG PO CAPS: ORAL_CAPSULE | ORAL | Status: DC

## 2014-09-25 NOTE — Telephone Encounter (Signed)
I left Sarah Huber a message to call the office in regards to this medication

## 2014-09-25 NOTE — Telephone Encounter (Signed)
I tried to several times to get get Sarah Huber approved. It has been denied. She is requesting we give her something in place of this- eh

## 2014-09-25 NOTE — Telephone Encounter (Signed)
She can try midrin for her headaches  I sent RX to pharmacy

## 2014-10-03 ENCOUNTER — Encounter: Payer: Self-pay | Admitting: Pulmonary Disease

## 2014-10-03 ENCOUNTER — Ambulatory Visit (INDEPENDENT_AMBULATORY_CARE_PROVIDER_SITE_OTHER): Payer: 59 | Admitting: Pulmonary Disease

## 2014-10-03 VITALS — BP 102/82 | HR 94 | Ht 65.5 in | Wt 141.1 lb

## 2014-10-03 DIAGNOSIS — L509 Urticaria, unspecified: Secondary | ICD-10-CM

## 2014-10-03 DIAGNOSIS — J4541 Moderate persistent asthma with (acute) exacerbation: Secondary | ICD-10-CM

## 2014-10-03 MED ORDER — MOMETASONE FURO-FORMOTEROL FUM 200-5 MCG/ACT IN AERO: 2.0000 | INHALATION_SPRAY | Freq: Two times a day (BID) | RESPIRATORY_TRACT | Status: DC

## 2014-10-03 MED ORDER — PREDNISONE 10 MG PO TABS: ORAL_TABLET | ORAL | Status: AC

## 2014-10-03 NOTE — Patient Instructions (Signed)
Increase Dulera to 200/5 - 2 puffs twice daily Take zyrtec 10 mg daily For hives -Prednisone 10 mg tabs  Take 2 tabs daily with food x 5ds, then 1 tab daily with food x 5ds  If recurrent, may need a maintenance dose of 20 mg prednisone to suppress

## 2014-10-03 NOTE — Progress Notes (Signed)
   Subjective:    Patient ID: Sarah Huber, female    DOB: 22-Dec-1955, 59 y.o.   MRN: [id number]  HPI  59 year old social smoker, presents for evaluation of hives & bronchospasm Asthma Developed  in her 66s. She is maintained at least for the past 2 years and a regimen of dulera and albuterol. She reports her triggers has chemicals, tannins in wine and strong perfumes and orders.  Admitted 11/16 - 07/12/14 with status asthmaticus and bronchitis.Diffuse wheezing was noted. Episode was preceded by severe facial hives. 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 She does admit to some degree of dysphagia   Environment - has 2 dogs and a pet parrot for 19 years. Otherwise no new Linen or makeup. She has tried changing all these chemicals without any difference, dc'd hair dye, bleach Allergy testing -sharma - mold, cobalt (no problems with jewelry) Allergy to aspirin -palpitations noted, med review- taking naproxen for headaches  She smoked less than 10 pack years   Chief Complaint  Patient presents with  . Follow-up    SOB, chest tightness, allergies, she is using neb treatments every night   Accompanied by her husband Hives have been recurrent- she showed me  cell phone pictures -she saw allergist and dermatologist- trigger has not been identified yet ,and has been referred to [facility] Prednisone makes her jittery & causes insomnia  Review of Systems neg for any significant sore throat, dysphagia, itching, sneezing, nasal congestion or excess/ purulent secretions, fever, chills, sweats, unintended wt loss, pleuritic or exertional cp, hempoptysis, orthopnea pnd or change in chronic leg swelling. Also denies presyncope, palpitations, heartburn, abdominal pain, nausea, vomiting,  diarrhea or change in bowel or urinary habits, dysuria,hematuria, rash, arthralgias, visual complaints, headache, numbness weakness or ataxia.     Objective:   Physical Exam  Gen. Pleasant, well-nourished, in no distress, normal affect ENT - no lesions, no post nasal drip Neck: No JVD, no thyromegaly, no carotid bruits Lungs: no use of accessory muscles, no dullness to percussion, clear without rales or rhonchi  Cardiovascular: Rhythm regular, heart sounds  normal, no murmurs or gallops, no peripheral edema Abdomen: soft and non-tender, no hepatosplenomegaly, BS normal. Musculoskeletal: No deformities, no cyanosis or clubbing Neuro:  alert, non focal       Assessment & Plan:

## 2014-10-03 NOTE — Assessment & Plan Note (Signed)
Increase Dulera to 200/5 - 2 puffs twice daily Take zyrtec 10 mg daily For hives -Prednisone 10 mg tabs  Take 2 tabs daily with food x 5ds, then 1 tab daily with food x 5ds  If recurrent or if allergists unable to find etiology, may need a maintenance dose of 20 mg prednisone to suppress (then titrate to lowest dose ) We discussed side effects of steroids

## 2014-10-05 NOTE — Assessment & Plan Note (Signed)
We again discussed avoidance measures-hair dye, cosmetics, facial cleansers, soap etc. We discussed the possibility that they would not find her trigger-and she may need chronic low-dose prednisone to suppress.

## 2014-11-12 ENCOUNTER — Other Ambulatory Visit: Payer: Self-pay | Admitting: Internal Medicine

## 2014-11-12 NOTE — Telephone Encounter (Signed)
Refill request

## 2014-11-12 NOTE — Telephone Encounter (Signed)
Rx called in 

## 2014-11-28 ENCOUNTER — Encounter: Payer: Self-pay | Admitting: Pulmonary Disease

## 2014-11-28 ENCOUNTER — Ambulatory Visit (INDEPENDENT_AMBULATORY_CARE_PROVIDER_SITE_OTHER): Payer: 59 | Admitting: Pulmonary Disease

## 2014-11-28 VITALS — BP 109/75 | HR 98 | Temp 97.8°F | Ht 66.0 in | Wt 138.0 lb

## 2014-11-28 DIAGNOSIS — J4541 Moderate persistent asthma with (acute) exacerbation: Secondary | ICD-10-CM

## 2014-11-28 NOTE — Patient Instructions (Signed)
Stay on dulera twice daily Stay on singulair daily  Zyrtec twice daily If breathing better by end April, can back off on Ranitidine to once daily

## 2014-11-28 NOTE — Assessment & Plan Note (Signed)
Stay on dulera twice daily Stay on singulair daily  Zyrtec twice daily If breathing better by end April, can back off on Ranitidine to once daily

## 2014-11-28 NOTE — Progress Notes (Signed)
   Subjective:    Patient ID: Sarah Huber, female    DOB: [birth year], 59 y.o.   MRN: [id number]  HPI 59 year old social smoker,for FU of hives & bronchospasm Asthma Developed  in her 12s. She is maintained at least for the past 2 years and a regimen of dulera and albuterol. She reports her triggers has chemicals, tannins in wine and strong perfumes and orders.  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 She does admit to some degree of dysphagia   Environment - has 2 dogs and a pet parrot for 19 years. Otherwise no new Linen or makeup. She has tried changing all these chemicals without any difference, dc'd hair dye, bleach Allergy testing -sharma - mold, cobalt (no problems with jewelry) Allergy to aspirin -palpitations noted, med review- taking naproxen for headaches Prednisone makes her jittery & causes insomnia She smoked less than 10 pack years   Significant tests/ events  Admitted 11/16 - 07/12/14 with status asthmaticus and bronchitis.Diffuse wheezing was noted. Episode was preceded by severe facial hives.    11/28/2014  Chief Complaint  Patient presents with  . Follow-up    Some cough, chest tightness, wants to check vocal cords for scarring   she saw allergist and dermatologist- trigger has not been identified yet ,Saw pulm /allergy at [facility] FEV1 80%  Review of Systems  neg for any significant sore throat, dysphagia, itching, sneezing, nasal congestion or excess/ purulent secretions, fever, chills, sweats, unintended wt loss, pleuritic or exertional cp, hempoptysis, orthopnea pnd or change in chronic leg swelling. Also denies presyncope, palpitations, heartburn, abdominal pain, nausea, vomiting, diarrhea or change in bowel or urinary habits, dysuria,hematuria, rash,  arthralgias, visual complaints, headache, numbness weakness or ataxia.     Objective:   Physical Exam  Gen. Pleasant, well-nourished, in no distress ENT - no lesions, no post nasal drip Neck: No JVD, no thyromegaly, no carotid bruits Lungs: no use of accessory muscles, no dullness to percussion, clear without rales or rhonchi  Cardiovascular: Rhythm regular, heart sounds  normal, no murmurs or gallops, no peripheral edema Musculoskeletal: No deformities, no cyanosis or clubbing        Assessment & Plan:

## 2014-12-23 ENCOUNTER — Other Ambulatory Visit: Payer: Self-pay | Admitting: Internal Medicine

## 2015-11-13 IMAGING — CR DG CHEST 2V
2 series · 2 of 2 positions shown · non-contrast
Comparison: 01/15/2014

CLINICAL DATA: Cough, congestion

EXAM:
CHEST  2 VIEW

[w chest pa]
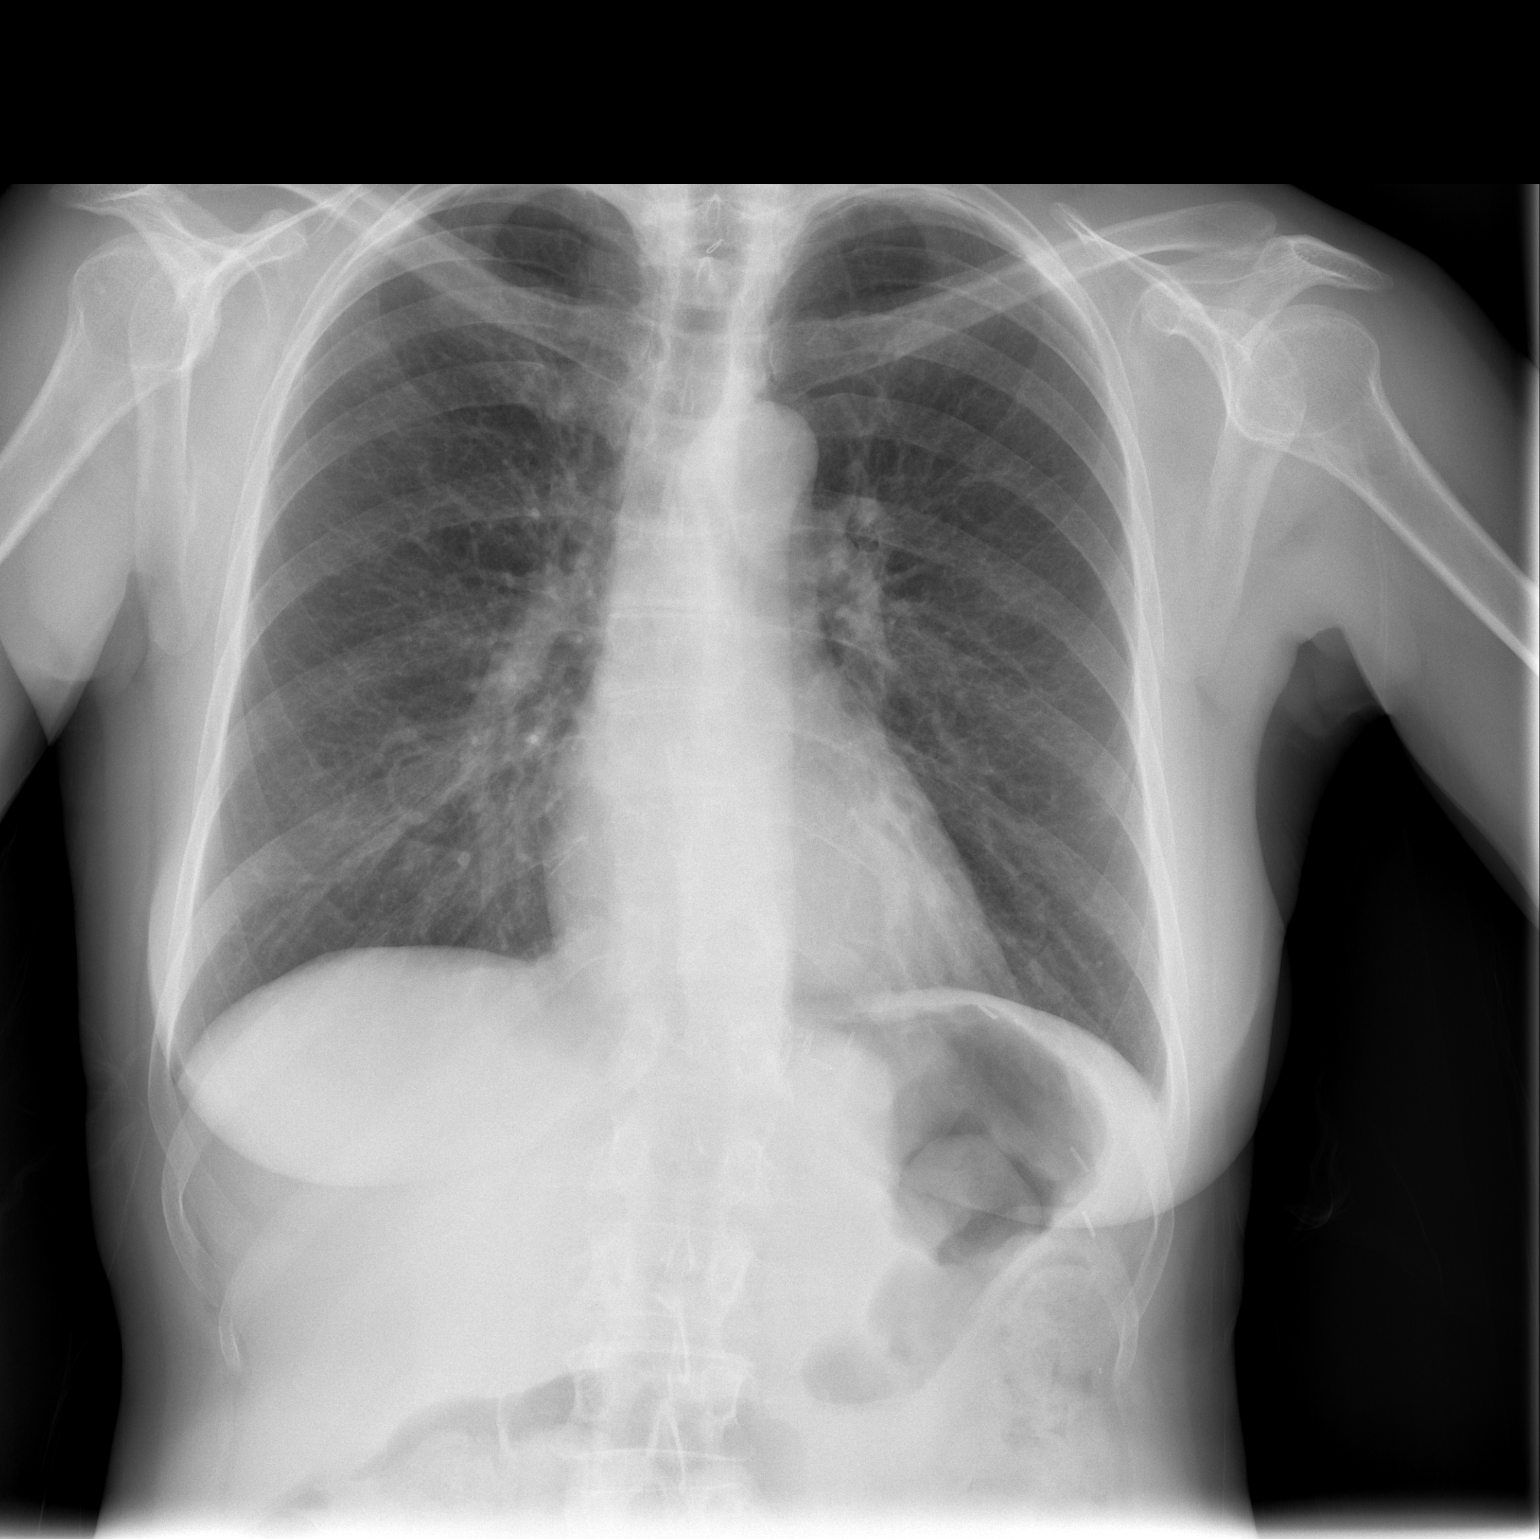

[w chest lat]
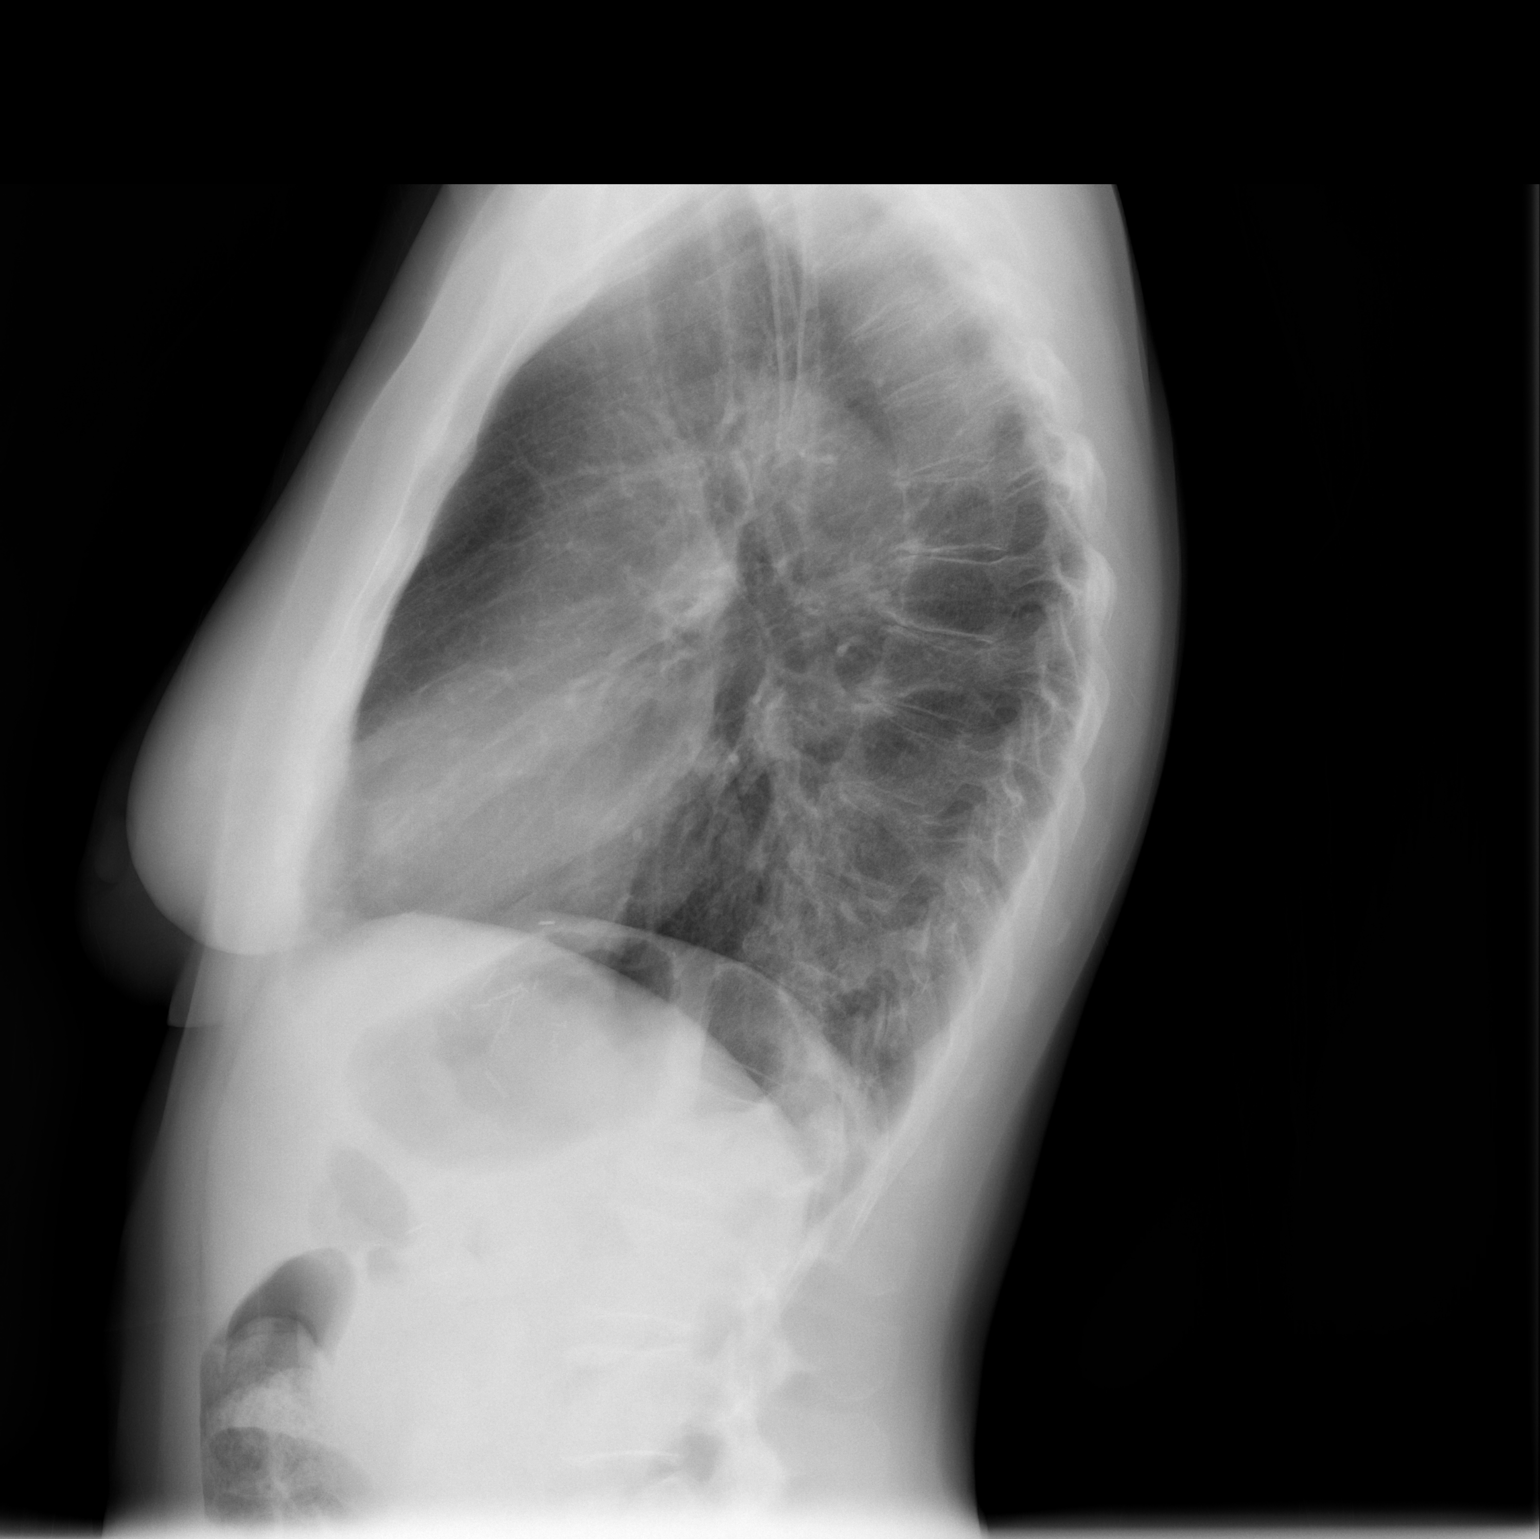

[2 of 2 positions shown; findings below may reference images not displayed]

FINDINGS: The heart size and mediastinal contours are within normal limits.
Both lungs are clear. The visualized skeletal structures are
unremarkable.
IMPRESSION: No active cardiopulmonary disease.

## 2015-11-16 IMAGING — CR DG ABDOMEN ACUTE W/ 1V CHEST
4 series · 4 of 4 positions shown · non-contrast
Comparison: 07/10/2014

CLINICAL DATA: Left lower abdominal pain and nausea, weakness

EXAM:
ACUTE ABDOMEN SERIES (ABDOMEN 2 VIEW & CHEST 1 VIEW)

[w chest pa]
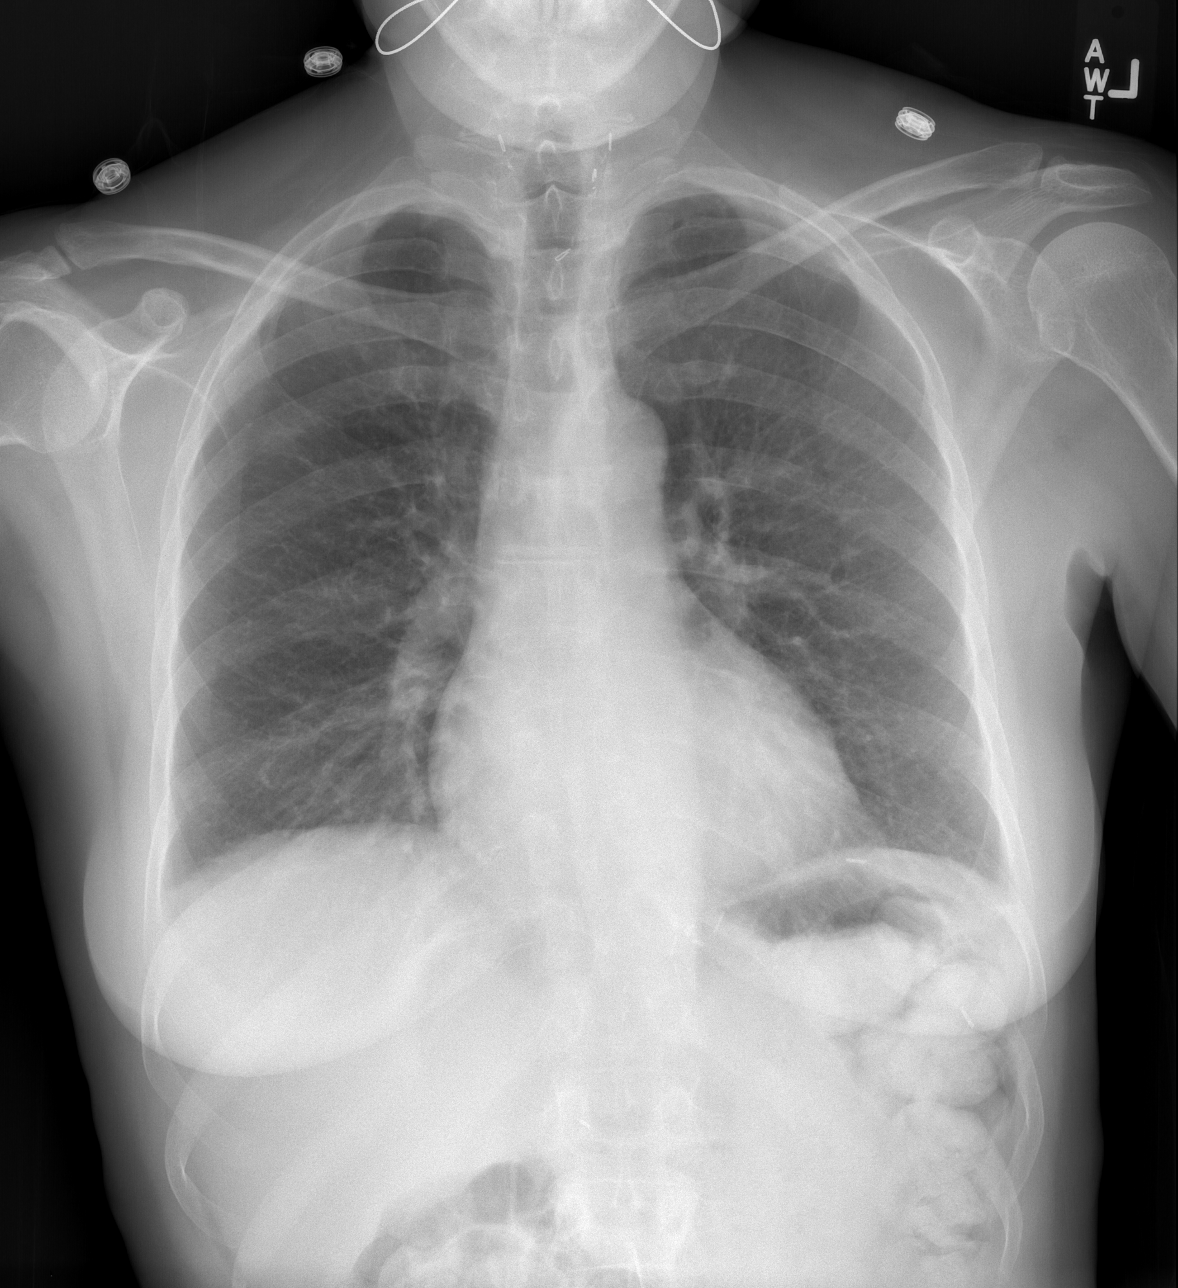

[w abdomen upright *]
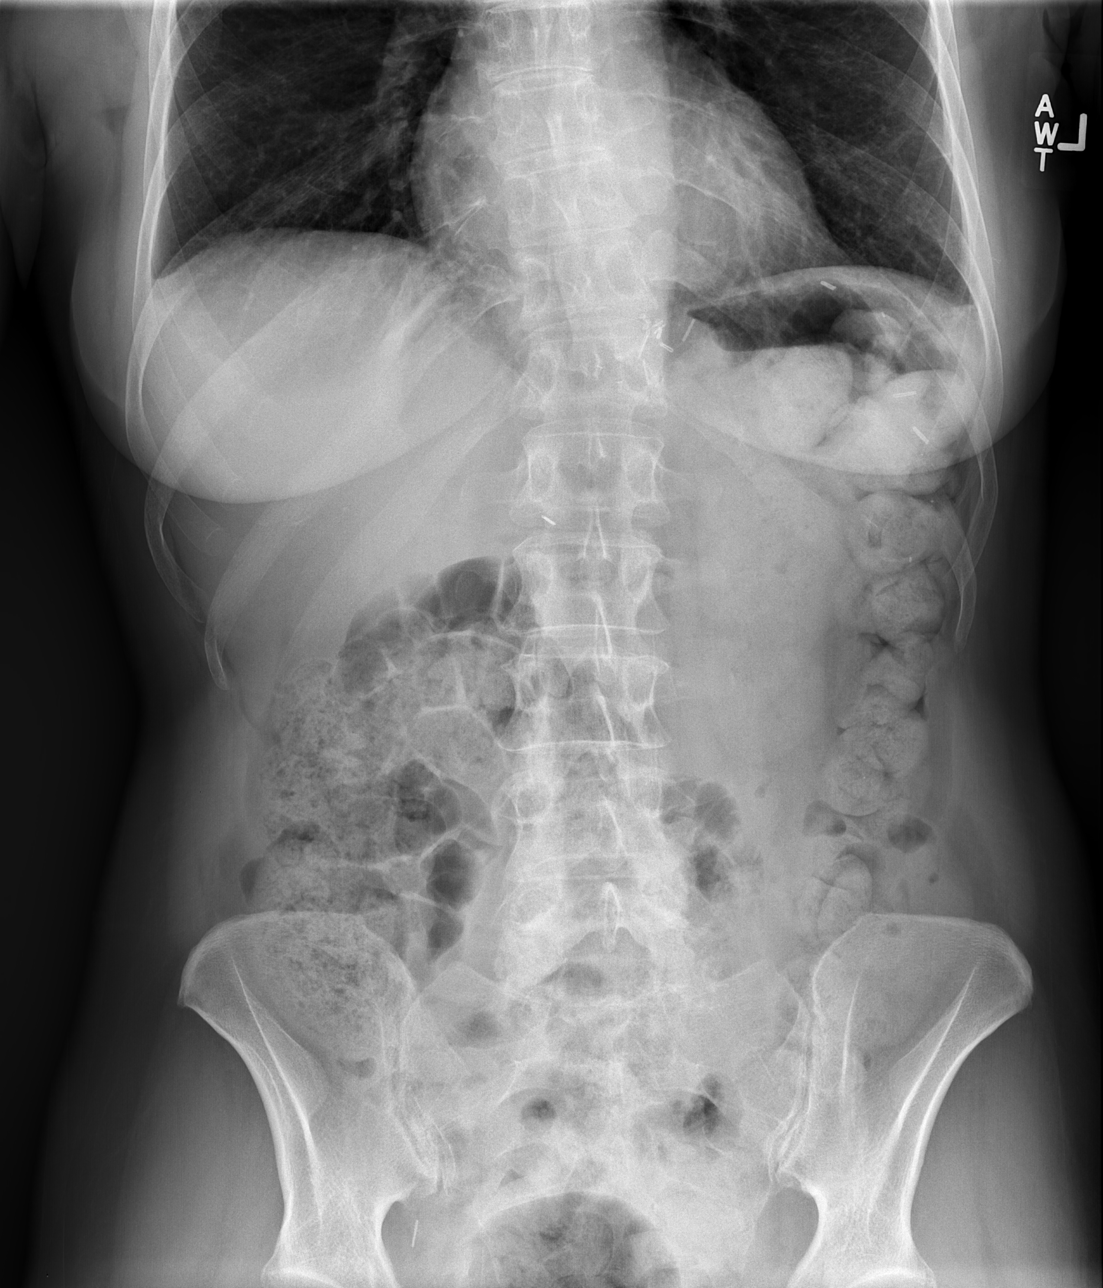

[t abdomen supine (1 of 2)]
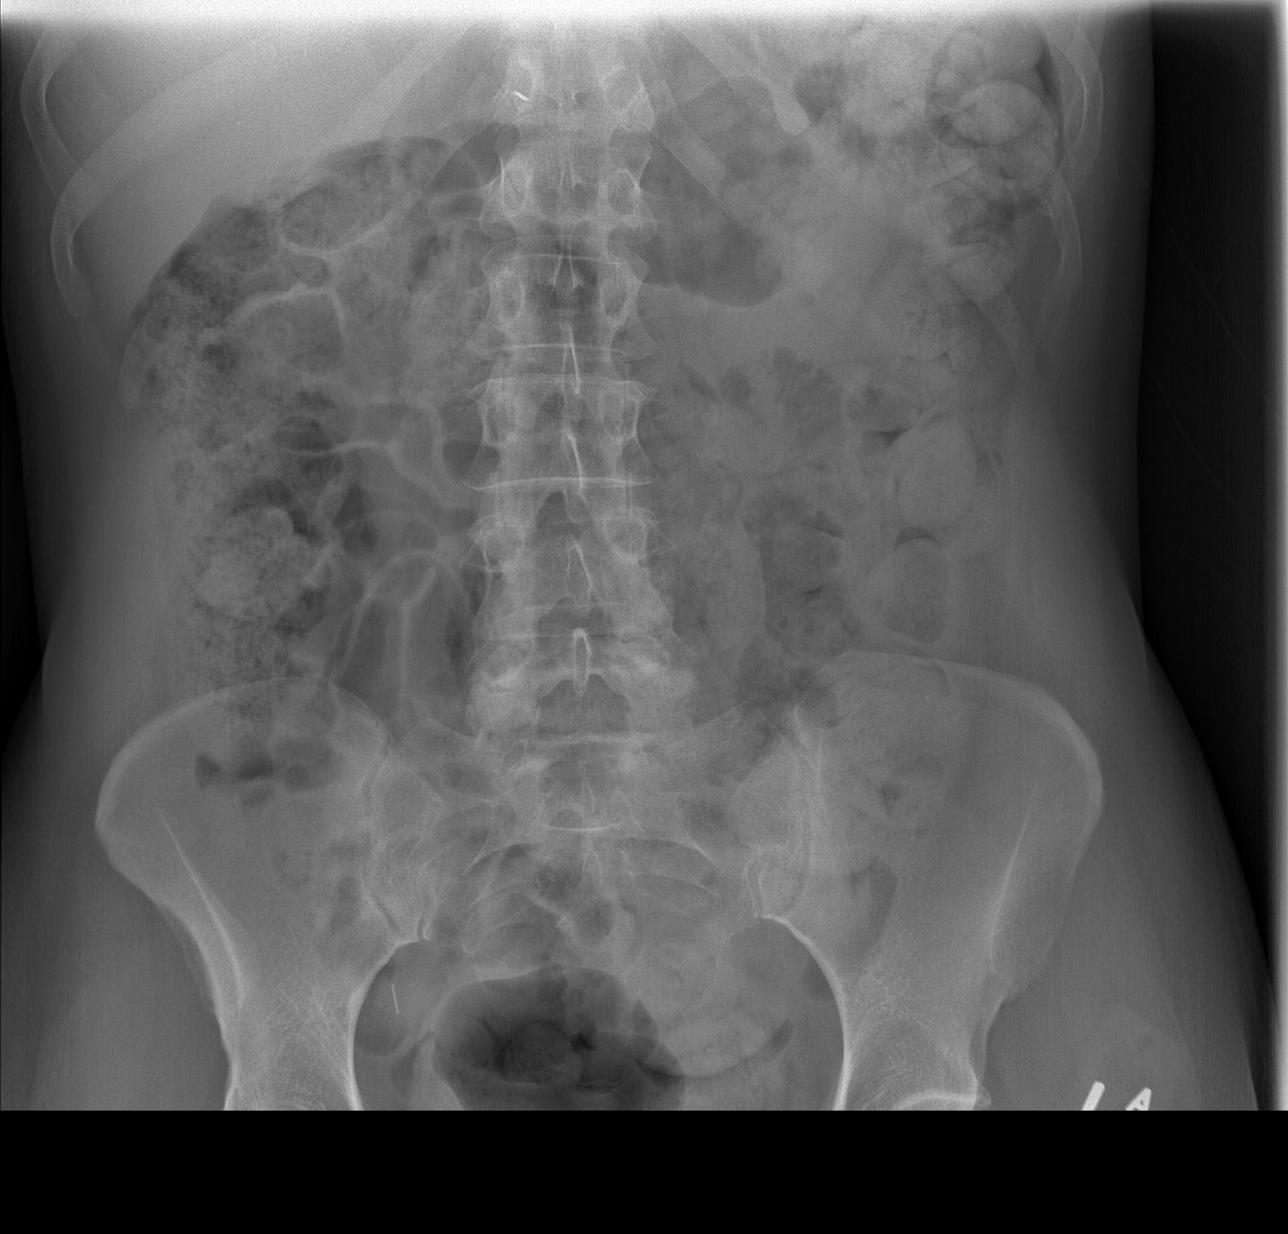

[t abdomen supine (2 of 2)]
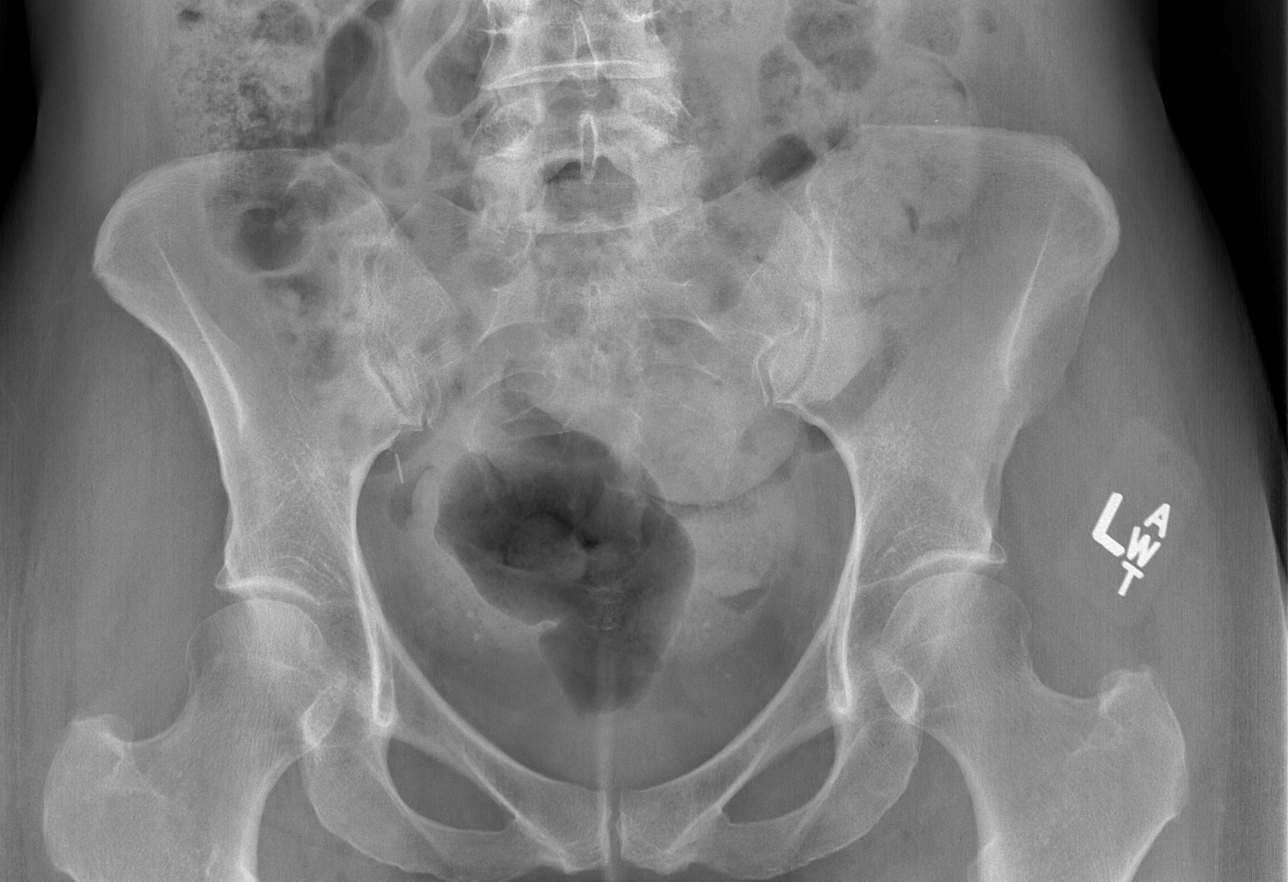

[4 of 4 positions shown; findings below may reference images not displayed]

FINDINGS: Cardiomediastinal silhouette is stable. No acute infiltrate or
pleural effusion. No pulmonary edema. There is nonspecific
nonobstructive bowel gas pattern. Abundant colonic stool. Moderate
gas noted within rectum. No free abdominal air.
IMPRESSION: No acute disease within chest. Nonspecific nonobstructive small
bowel gas pattern. Abundant colonic stool. Moderate gas noted within
rectum

## 2017-11-30 DIAGNOSIS — C73 Malignant neoplasm of thyroid gland: Secondary | ICD-10-CM | POA: Diagnosis not present

## 2017-11-30 DIAGNOSIS — E890 Postprocedural hypothyroidism: Secondary | ICD-10-CM | POA: Diagnosis not present

## 2017-11-30 DIAGNOSIS — K219 Gastro-esophageal reflux disease without esophagitis: Secondary | ICD-10-CM | POA: Diagnosis not present

## 2017-11-30 DIAGNOSIS — J4540 Moderate persistent asthma, uncomplicated: Secondary | ICD-10-CM | POA: Diagnosis not present

## 2018-03-28 DIAGNOSIS — R030 Elevated blood-pressure reading, without diagnosis of hypertension: Secondary | ICD-10-CM | POA: Diagnosis not present

## 2018-03-28 DIAGNOSIS — C73 Malignant neoplasm of thyroid gland: Secondary | ICD-10-CM | POA: Diagnosis not present

## 2018-03-28 DIAGNOSIS — E890 Postprocedural hypothyroidism: Secondary | ICD-10-CM | POA: Diagnosis not present

## 2018-03-28 DIAGNOSIS — F17200 Nicotine dependence, unspecified, uncomplicated: Secondary | ICD-10-CM | POA: Diagnosis not present

## 2018-04-04 ENCOUNTER — Other Ambulatory Visit: Payer: Self-pay | Admitting: Internal Medicine

## 2018-04-04 ENCOUNTER — Ambulatory Visit
Admission: RE | Admit: 2018-04-04 | Discharge: 2018-04-04 | Disposition: A | Discharge status: Home / Self Care | Payer: Self-pay | Source: Ambulatory Visit | Attending: Internal Medicine | Admitting: Internal Medicine

## 2018-04-04 DIAGNOSIS — R0789 Other chest pain: Secondary | ICD-10-CM | POA: Diagnosis not present

## 2018-04-04 DIAGNOSIS — I10 Essential (primary) hypertension: Secondary | ICD-10-CM

## 2018-04-04 DIAGNOSIS — Z85850 Personal history of malignant neoplasm of thyroid: Secondary | ICD-10-CM | POA: Diagnosis not present

## 2018-04-04 DIAGNOSIS — Z1231 Encounter for screening mammogram for malignant neoplasm of breast: Secondary | ICD-10-CM

## 2018-04-04 DIAGNOSIS — E890 Postprocedural hypothyroidism: Secondary | ICD-10-CM | POA: Diagnosis not present

## 2018-04-04 DIAGNOSIS — C73 Malignant neoplasm of thyroid gland: Secondary | ICD-10-CM | POA: Diagnosis not present

## 2018-04-13 ENCOUNTER — Ambulatory Visit
Admission: RE | Admit: 2018-04-13 | Discharge: 2018-04-13 | Disposition: A | Discharge status: Home / Self Care | Payer: BLUE CROSS/BLUE SHIELD | Source: Ambulatory Visit | Attending: Internal Medicine | Admitting: Internal Medicine

## 2018-04-13 DIAGNOSIS — Z1231 Encounter for screening mammogram for malignant neoplasm of breast: Secondary | ICD-10-CM

## 2018-04-13 DIAGNOSIS — I150 Renovascular hypertension: Secondary | ICD-10-CM | POA: Diagnosis not present

## 2018-04-13 DIAGNOSIS — I10 Essential (primary) hypertension: Secondary | ICD-10-CM

## 2018-04-14 DIAGNOSIS — Z85850 Personal history of malignant neoplasm of thyroid: Secondary | ICD-10-CM | POA: Diagnosis not present

## 2018-04-14 DIAGNOSIS — C73 Malignant neoplasm of thyroid gland: Secondary | ICD-10-CM | POA: Diagnosis not present

## 2018-04-17 DIAGNOSIS — I10 Essential (primary) hypertension: Secondary | ICD-10-CM | POA: Diagnosis not present

## 2018-04-17 DIAGNOSIS — Z8669 Personal history of other diseases of the nervous system and sense organs: Secondary | ICD-10-CM | POA: Diagnosis not present

## 2018-04-17 DIAGNOSIS — E890 Postprocedural hypothyroidism: Secondary | ICD-10-CM | POA: Diagnosis not present

## 2018-05-08 ENCOUNTER — Ambulatory Visit: Payer: Self-pay

## 2018-05-22 DIAGNOSIS — Z01818 Encounter for other preprocedural examination: Secondary | ICD-10-CM | POA: Diagnosis not present

## 2018-05-22 DIAGNOSIS — H25811 Combined forms of age-related cataract, right eye: Secondary | ICD-10-CM | POA: Diagnosis not present

## 2018-06-09 DIAGNOSIS — H25811 Combined forms of age-related cataract, right eye: Secondary | ICD-10-CM | POA: Diagnosis not present

## 2018-06-09 DIAGNOSIS — H2511 Age-related nuclear cataract, right eye: Secondary | ICD-10-CM | POA: Diagnosis not present

## 2018-06-09 DIAGNOSIS — H25011 Cortical age-related cataract, right eye: Secondary | ICD-10-CM | POA: Diagnosis not present

## 2018-06-23 DIAGNOSIS — H25812 Combined forms of age-related cataract, left eye: Secondary | ICD-10-CM | POA: Diagnosis not present

## 2018-06-23 DIAGNOSIS — H25012 Cortical age-related cataract, left eye: Secondary | ICD-10-CM | POA: Diagnosis not present

## 2018-06-23 DIAGNOSIS — H2512 Age-related nuclear cataract, left eye: Secondary | ICD-10-CM | POA: Diagnosis not present

## 2018-12-29 DIAGNOSIS — I10 Essential (primary) hypertension: Secondary | ICD-10-CM | POA: Diagnosis not present

## 2018-12-29 DIAGNOSIS — L309 Dermatitis, unspecified: Secondary | ICD-10-CM | POA: Diagnosis not present

## 2018-12-29 DIAGNOSIS — F17210 Nicotine dependence, cigarettes, uncomplicated: Secondary | ICD-10-CM | POA: Diagnosis not present

## 2018-12-30 DIAGNOSIS — I10 Essential (primary) hypertension: Secondary | ICD-10-CM | POA: Diagnosis not present

## 2019-01-03 DIAGNOSIS — I10 Essential (primary) hypertension: Secondary | ICD-10-CM | POA: Diagnosis not present

## 2019-01-03 DIAGNOSIS — G8928 Other chronic postprocedural pain: Secondary | ICD-10-CM | POA: Diagnosis not present

## 2019-01-03 DIAGNOSIS — E890 Postprocedural hypothyroidism: Secondary | ICD-10-CM | POA: Diagnosis not present

## 2019-01-03 DIAGNOSIS — J4540 Moderate persistent asthma, uncomplicated: Secondary | ICD-10-CM | POA: Diagnosis not present

## 2019-01-31 DIAGNOSIS — G8928 Other chronic postprocedural pain: Secondary | ICD-10-CM | POA: Diagnosis not present

## 2019-01-31 DIAGNOSIS — J4540 Moderate persistent asthma, uncomplicated: Secondary | ICD-10-CM | POA: Diagnosis not present

## 2019-01-31 DIAGNOSIS — I10 Essential (primary) hypertension: Secondary | ICD-10-CM | POA: Diagnosis not present

## 2019-01-31 DIAGNOSIS — G43009 Migraine without aura, not intractable, without status migrainosus: Secondary | ICD-10-CM | POA: Diagnosis not present

## 2019-05-04 DIAGNOSIS — J4540 Moderate persistent asthma, uncomplicated: Secondary | ICD-10-CM | POA: Diagnosis not present

## 2019-05-04 DIAGNOSIS — Z7951 Long term (current) use of inhaled steroids: Secondary | ICD-10-CM | POA: Diagnosis not present

## 2019-05-04 DIAGNOSIS — Z23 Encounter for immunization: Secondary | ICD-10-CM | POA: Diagnosis not present

## 2019-05-04 DIAGNOSIS — E890 Postprocedural hypothyroidism: Secondary | ICD-10-CM | POA: Diagnosis not present

## 2019-05-04 DIAGNOSIS — Z9081 Acquired absence of spleen: Secondary | ICD-10-CM | POA: Diagnosis not present

## 2019-05-04 DIAGNOSIS — C73 Malignant neoplasm of thyroid gland: Secondary | ICD-10-CM | POA: Diagnosis not present

## 2019-05-04 DIAGNOSIS — Z1159 Encounter for screening for other viral diseases: Secondary | ICD-10-CM | POA: Diagnosis not present

## 2019-05-04 DIAGNOSIS — I10 Essential (primary) hypertension: Secondary | ICD-10-CM | POA: Diagnosis not present

## 2019-05-04 DIAGNOSIS — M8580 Other specified disorders of bone density and structure, unspecified site: Secondary | ICD-10-CM | POA: Diagnosis not present

## 2019-05-04 DIAGNOSIS — F17210 Nicotine dependence, cigarettes, uncomplicated: Secondary | ICD-10-CM | POA: Diagnosis not present

## 2019-05-04 DIAGNOSIS — Z85850 Personal history of malignant neoplasm of thyroid: Secondary | ICD-10-CM | POA: Diagnosis not present
